# Patient Record
Sex: Female | Born: 1980
Health system: Southern US, Community
[De-identification: ages and names within clinical notes are randomized; demographics above are authoritative.]

## PROBLEM LIST (undated history)

## (undated) DIAGNOSIS — IMO0002 Reserved for concepts with insufficient information to code with codable children: Secondary | ICD-10-CM

## (undated) DIAGNOSIS — O99345 Other mental disorders complicating the puerperium: Secondary | ICD-10-CM

## (undated) DIAGNOSIS — F53 Postpartum depression: Secondary | ICD-10-CM

## (undated) HISTORY — DX: Reserved for concepts with insufficient information to code with codable children: IMO0002

## (undated) HISTORY — DX: Postpartum depression: F53.0

## (undated) HISTORY — PX: TUBAL LIGATION: SHX77

## (undated) HISTORY — DX: Other mental disorders complicating the puerperium: O99.345

---

## 2010-05-19 ENCOUNTER — Encounter: Payer: Self-pay | Admitting: Family Medicine

## 2010-05-19 ENCOUNTER — Ambulatory Visit: Admission: RE | Admit: 2010-05-19 | Discharge: 2010-05-19 | Payer: Self-pay | Source: Home / Self Care

## 2010-05-19 ENCOUNTER — Other Ambulatory Visit: Payer: Self-pay | Admitting: Family Medicine

## 2010-05-19 LAB — CONVERTED CEMR LAB
Antibody Screen: NEGATIVE
Eosinophils Relative: 1 % (ref 0–5)
HCT: 36.1 % (ref 36.0–46.0)
Hemoglobin: 11.8 g/dL — ABNORMAL LOW (ref 12.0–15.0)
Lymphocytes Relative: 27 % (ref 12–46)
MCHC: 32.7 g/dL (ref 30.0–36.0)
Monocytes Absolute: 0.9 10*3/uL (ref 0.1–1.0)
Monocytes Relative: 12 % (ref 3–12)
Neutro Abs: 4.5 10*3/uL (ref 1.7–7.7)
RBC: 4.08 M/uL (ref 3.87–5.11)
Rh Type: POSITIVE
Rubella: 496.8 intl units/mL — ABNORMAL HIGH
Sickle Cell Screen: NEGATIVE

## 2010-05-20 LAB — OBSTETRIC PANEL
Eosinophils Absolute: 0.1 10*3/uL (ref 0.0–0.7)
Eosinophils Relative: 1 % (ref 0–5)
HCT: 36.1 % (ref 36.0–46.0)
Hemoglobin: 11.8 g/dL — ABNORMAL LOW (ref 12.0–15.0)
Lymphocytes Relative: 27 % (ref 12–46)
Lymphs Abs: 2 10*3/uL (ref 0.7–4.0)
MCH: 28.9 pg (ref 26.0–34.0)
MCV: 88.5 fL (ref 78.0–100.0)
Monocytes Relative: 12 % (ref 3–12)
Platelets: 230 10*3/uL (ref 150–400)
RBC: 4.08 MIL/uL (ref 3.87–5.11)
Rh Type: POSITIVE
Rubella: 496.8 IU/mL — ABNORMAL HIGH
WBC: 7.5 10*3/uL (ref 4.0–10.5)

## 2010-05-21 LAB — SICKLE CELL SCREEN: Sickle Cell Screen: NEGATIVE

## 2010-05-26 ENCOUNTER — Encounter: Payer: Self-pay | Admitting: Family Medicine

## 2010-05-26 ENCOUNTER — Other Ambulatory Visit: Payer: Self-pay | Admitting: Family Medicine

## 2010-05-26 ENCOUNTER — Encounter (INDEPENDENT_AMBULATORY_CARE_PROVIDER_SITE_OTHER): Payer: Self-pay | Admitting: Family Medicine

## 2010-05-26 ENCOUNTER — Other Ambulatory Visit: Payer: Self-pay

## 2010-05-26 DIAGNOSIS — Z3480 Encounter for supervision of other normal pregnancy, unspecified trimester: Secondary | ICD-10-CM

## 2010-05-26 DIAGNOSIS — B373 Candidiasis of vulva and vagina: Secondary | ICD-10-CM

## 2010-05-26 DIAGNOSIS — Z124 Encounter for screening for malignant neoplasm of cervix: Secondary | ICD-10-CM

## 2010-05-26 DIAGNOSIS — B3731 Acute candidiasis of vulva and vagina: Secondary | ICD-10-CM

## 2010-05-26 DIAGNOSIS — N3000 Acute cystitis without hematuria: Secondary | ICD-10-CM | POA: Insufficient documentation

## 2010-05-27 LAB — GC/CHLAMYDIA PROBE AMP, GENITAL
Chlamydia, DNA Probe: NEGATIVE
GC Probe Amp, Genital: NEGATIVE

## 2010-05-28 ENCOUNTER — Ambulatory Visit: Payer: Self-pay

## 2010-05-28 ENCOUNTER — Ambulatory Visit (INDEPENDENT_AMBULATORY_CARE_PROVIDER_SITE_OTHER): Payer: Self-pay | Admitting: *Deleted

## 2010-05-28 DIAGNOSIS — Z0130 Encounter for examination of blood pressure without abnormal findings: Secondary | ICD-10-CM

## 2010-05-28 DIAGNOSIS — Z136 Encounter for screening for cardiovascular disorders: Secondary | ICD-10-CM

## 2010-05-28 NOTE — Progress Notes (Deleted)
Patient reports that after starting metoprolol she developed chest tightness . She only took 3 tabs of the medication . She has continued taking HCTZ. No further chest pain.  Rx for lisinopril was sent in but patient has not picked up yet. Consulted with Dr. Clotilde Dieter and she advises for patient to start with 1/2 tab of  Lisinopril 10 mg  ( 5 mg daily ). Return for BP check again in 2 weeks.

## 2010-05-28 NOTE — Miscellaneous (Signed)
  Clinical Lists Changes  Problems: Removed problem of VULVAR ABSCESS (ICD-616.4)

## 2010-05-28 NOTE — Progress Notes (Signed)
Patient seen with Myrlene Broker in RN clinic.  Verified positive PPD.  Patient states that she had a positive PPD in [place] 7 yrs ago; reports that she had a negative CXR then, was never treated with INH for prophylaxis.   Denies cough, sweats or hemoptysis.  Will not recommend INH or health department referral until after delivery.  Discussion held in both [place] and [place].

## 2010-05-28 NOTE — Progress Notes (Signed)
Patient here for PPD to be read. PPD 20 X 24 mm. Patient reports she had a positive PPD 7 years ago. Dr. Mauricio Po notified and came in to see patient . He states at this time  no follow up required . Will address issue after patient delivers baby.

## 2010-06-05 ENCOUNTER — Ambulatory Visit (INDEPENDENT_AMBULATORY_CARE_PROVIDER_SITE_OTHER): Payer: Self-pay | Admitting: Family Medicine

## 2010-06-05 VITALS — BP 109/70 | Wt 146.7 lb

## 2010-06-05 DIAGNOSIS — Z3480 Encounter for supervision of other normal pregnancy, unspecified trimester: Secondary | ICD-10-CM

## 2010-06-05 DIAGNOSIS — N3000 Acute cystitis without hematuria: Secondary | ICD-10-CM

## 2010-06-05 NOTE — Progress Notes (Signed)
Subjective:    Lori Vaughan is a 30 y.o. female being seen today for her obstetrical visit. She is at [redacted]w[redacted]d gestation. Patient reports no complaints. Fetal movement: normal.  Objective:    BP 109/70  Wt 146 lb 11.2 oz (66.543 kg)  LMP 01/17/2010  Physical Exam  Exam  FHT:  140 BPM  Uterine Size: size equals dates  Presentation: unsure     Assessment:    Pregnancy:  [id number]    Plan:    Patient Active Problem List  Diagnoses  . CANDIDIASIS, VULVOVAGINAL  . ACUTE CYSTITIS    Treated for asymptomatic bacteruria, completed ABx.  For TOC UCx today.   For [place] with Dr Gaynell Face, given card with phone number to schedule appt there.  Had positive PPD placed, prior hx pos PPD.  No cough/hemoptysis/wt loss or fever.  Will defer further prophylaxis until after delivery, will refer to health dept then.  Follow up in 4 weeks with Dr Ashley Royalty.

## 2010-06-05 NOTE — Patient Instructions (Signed)
Fue un Research officer, trade union.  Todo aparenta estar bien con su embarazo.   Como hablamos, quiero que vaya a hacerse el ultrasonido lo antes posible.  Tiene el telefono del Dr. Gaynell Face para marcar la cita.   Siga tomando las vitaminas antenatales.   Por favor marque una cita con el Dr. Ashley Royalty in 4 semanas.

## 2010-06-05 NOTE — Assessment & Plan Note (Signed)
Summary: NOB-18.5 weeks by LMP   OB Initial Intake Information    Positive HCG by: [facility] [facility] [employer]    Race: White    Marital status: Single    Occupation: homemaker    Education (last grade completed): 12    Number of children at home: 3  FOB Information    Husband/Father of baby: Shari Prows    FOB occupation Gardening  Menstrual History    LMP (date): 01/17/2010    EDC by LMP: 10/24/2010    LMP - Character: light    LMP - Reliable? : Yes    Menarche: 11 years    Menses interval: 28 days    Menstrual flow 3-4 days    On BCP's at conception: no    Date of positive (+) home preg. test: 04/03/2010    Pre Pregnancy Weight: 142 lbs.    Symptoms since LMP: amenorrhea, nausea, fatigue, irritability, urinary frequency  Past Pregnancy History    Gravida:     5    Term Births:     3    Premature Births:   0    Living Children:   3    Para:       3    Mult. Births:     0    Prev C-Section:   3    Prev. VBAC attempt?   none    Aborta:     1    Elect. Ab:     0    Spont. Ab:     1    Ectopics:     0  Pregnancy # 1    Delivery date:     01/04/1997    Weeks Gestation:   41    Preterm labor:     no    Delivery type:     C-section    Anesthesia type:     spinal    Delivery location:     [place]    Infant Sex:     Female    Name:     Lars Mage  Pregnancy # 2    Delivery date:     02/22/1998    Weeks Gestation:   37    Preterm labor:     no    Delivery type:     C-section    Anesthesia type:     spinal    Delivery location:     [place]    Infant Sex:     Female    Name:     Tobi Bastos  Pregnancy # 3    Delivery date:     2004    Preterm labor:     no    Anesthesia type:     none    Comments:     SAB  Pregnancy # 4    Delivery date:     05/24/2003    Weeks Gestation:   37    Preterm labor:     no    Delivery type:     C-section    Delivery location:     [place], [place]    Infant Sex:     Female    Birth weight:     7lbs    Name:     Air cabin crew  Social History: Education:  12 Hepatitis Risk:   no Packs/Day:  n/a  Risk Factors:   Genetic History    Genetic History Reviewed:    Father of baby:   Shari Prows  Thalassemia:     mother: no   father: no    Neural tube defect:   mother: no   father: no    Down's Syndrome:   mother: no   father: no    Tay-Sachs:     mother: no   father: no    Sickle Cell Dz/Trait:   mother: no   father: no    Hemophilia:     mother: no   father: no    Muscular Dystrophy:   mother: no   father: no    Cystic Fibrosis:   mother: no   father: no    Huntington's Dz:   mother: no   father: no    Mental Retardation:   mother: no   father: no    Fragile X:     mother: no   father: no    Other Genetic or       Chromosomal Dz:   mother: no   father: no    Child with other       birth defect:     mother: no   father: no    > 3 spont. abortions:   mother: no    Hx of stillbirth:     mother: no  Infection Risk History    High Risk Hepatitis B: no    Immunized against Hepatitis B: yes    Exposure to TB: no    Patient with history of Genital Herpes: no    Sexual partner with history of Genital Herpes: no    History of STD (GC, Chlamydia, Syphilis, HPV): yes    Specific STD: Chlamydia    Rash, Viral, or Febrile Illness since LMP: no    Exposure to Cat Litter: no    History of Parvovirus (Fifth Disease): no    Occupational Exposure to Children: none  Environmental Exposures    Xray Exposure since LMP: no    Chemical or other exposure: no    Medication, drug, or alcohol use since LMP: no  Additional Infection/Environmental Comments:    Chlamydia 6 years ago  [employer] for Follow-up Visit    Estimated weeks of       gestation:     18.5    Weight:     149.2    Blood pressure:   120 / 64    Hx headache?     daily    Nausea/vomiting?   nausea    Edema?     0    Bleeding?     no    Leakage/discharge?   d/c    Fetal activity:       no    Labor symptoms?   no    Fundal height:      20    FHR:       150    Fetal position:      N/A    Cx  dilation:     0    Cx effacement:   0    Fetal station:     high    Taking Vitamins?   Y    Smoking PPD:   n/a    Next visit:     4 wk    Resident:     CEM    Preceptor:     Sheffield Slider  Physical Examination  Vital Signs:  P: 84  BP (upright): 120/64  Wt: 149.2    General Exam:  Constitutional:    alert, no acute distress,  well hydrated, well developed, and well nourished.   Skin:    normal turgor, normal color, no rashes, no lesions, and no unusual bruising.   Head:    atraumatic and normocephalic.   Eyes:    EOM intact, PERRLA, vision grossly normal, and fundi normal.   Ears:    no external deformities, canals clear, and TM's pearly gray.   Mouth:    good dentition, no erythema, no exudates, and no lesions.   Neck:    supple, no adenopathy, no masses, thyroid normal size, and no thyroid tenderness or nodules.   Cardiovascular:    RRR, no murmurs, no gallops, and no edema.   Respiratory:    no respiratory distress and clear to auscultation.   Abdomen:    gravid, nontender, no guarding, and normal BS.   Neurologic:    cranial nerves II-XII intact and DTR's normal.   Psychiatric:    affect and mood appropriate, normal interaction, and good eye contact.    Pelvic Exam:  Vulva:    normal appearance.   Vagina:    normal.   Cervix:    normal, parous, no motion tenderness, no lesions, and FRIABLE.  Curd like d/c Uterus:    gravid and non-tender.    Impression & Recommendations:  Problem # 1:  SUPERVISION OF OTHER NORMAL PREGNANCY (ICD-V22.1) Doing well, pap, gc/chlamydia done today.  Having daily headaches, bp wnl today, will continue to monitor through her f/u appointments.  Pt. wants [place] but does not have enough money at this time, plans to have done first part of march when she has the money.  Also with history of positive PPD with negative CXR, will place another PPD today.  Plans to have repeat c/s, will need to be set up for rpt c/s at 39 weeks Orders: Wet Prep- [facility]  [id number]) GC/Chlamydia-[facility] ([id number]) Pap Smear-[facility] ([id number]) Prenatal U/S > 14 weeks - 62952 (Prenatal U/S) Other OB visit- [facility] (OBCK)  Problem # 2:  CANDIDIASIS, VULVOVAGINAL (ICD-112.1) Wet prep c/w yeast infection.  WIll treat with diflucan Her updated medication list for this problem includes:    Diflucan 150 Mg Tabs (Fluconazole) .Marland Kitchen... 1 tab by mouth once  Orders: Other OB visit- [facility] (OBCK)  Problem # 3:  ACUTE CYSTITIS (ICD-595.0) Urine cx with >100,000 colonies of E. Coli, also symptomatic.  WIll treat with Keflex. Her updated medication list for this problem includes:    Keflex 500 Mg Caps (Cephalexin) .Marland Kitchen... 1 tab by mouth two times a day  Orders: Other OB visit- [facility] (OBCK)  Complete Medication List: 1)  Prenatal/folic Acid Tabs (Prenatal vit-fe fumarate-fa) .Marland Kitchen.. 1 tab by mouth daily 2)  Keflex 500 Mg Caps (Cephalexin) .Marland Kitchen.. 1 tab by mouth two times a day 3)  Diflucan 150 Mg Tabs (Fluconazole) .Marland Kitchen.. 1 tab by mouth once  Other Orders: TB Skin Test [phone]) Admin 1st Vaccine ([id number])  Patient Instructions: 1)  Fue un placer conocerte hoy. Felicitaciones por su embarazo, espero que despus de que a travs de su embarazo. Que le permitir saber si alguno de sus pruebas a partir de hoy fueron anormales. He enviado una receta para [facility] [place] [place] de un antibitico para tratar su infeccin urinaria. Por favor, vuelve el mircoles para tener la prueba de la tuberculosis leer. Le hemos programado una cita con el Dr. Henri Medal el costo es de $ 200. Es posible que haya algunas manchas de Maria Stein de su examen en la actualidad. Si esto contina ms  all de unos pocos das nos dan una llamada. Quiero verte otra vez en 4 semanas.  Durante las prximas semanas se puede esperar para comenzar tal vez para sentir el movimiento del beb.  Flowsheet View for Follow-up Visit    Estimated weeks of       gestation:     18.5    Weight:     149.2    Blood pressure:   120 /  64    Headache:     daily    Nausea/vomiting:   nausea    Edema:     0    Vaginal bleeding:   no    Vaginal discharge:   d/c    Fundal height:      20    FHR:       150    Fetal activity:     no    Labor symptoms:   no    Fetal position:     N/A    Cx Dilation:     0    Cx Effacement:   0    Cx Station:     high    Taking prenatal vits?   Y    Smoking:     n/a    Next visit:     4 wk    Resident:     CEM    Preceptor:     Sheffield Slider   Prenatal Visit    FOB name: Shari Prows Concerns noted: Vaginal discharge, dysuria, burning and itching.   EDC Confirmation:    LMP reliable? Yes    Last menses onset (LMP) date: 01/17/2010    EDC by LMP: 10/24/2010   Immunizations Administered:  PPD Skin Test:    Vaccine Type: PPD    Site: left forearm    Mfr: Sanofi Pasteur    Dose: 0.1 ml    Route: ID    Given by: Jone Baseman CMA    Exp. Date: 12/21/2011    Lot #: [id number]   Laboratory Results  Date/Time Received: May 26, 2010 11:04 AM  Date/Time Reported: May 26, 2010 12:42 PM   [employer] Source: vag WBC/hpf: 1-5 Bacteria/hpf: 3+  Rods Clue cells/hpf: none  Negative whiff Yeast/hpf: many Trichomonas/hpf: none Comments: ...............test performed by......Marland KitchenBonnie A. Sw[place], MLS (ASCP)cm     Appended Document: NOB-18.5 weeks by LMP Note reviewed.  Will need repeat C/S planning visit with OB, for history of multiple C/S.  We are not placing PPDs on prenatal patients; patients with prior h/o positive PPD do not need another PPD.   Appended Document: NOB-18.5 weeks by LMP Will need a urine cx TOC in 3 weeks.

## 2010-06-07 LAB — URINE CULTURE: Colony Count: NO GROWTH

## 2010-07-02 ENCOUNTER — Ambulatory Visit (INDEPENDENT_AMBULATORY_CARE_PROVIDER_SITE_OTHER): Payer: Self-pay | Admitting: Family Medicine

## 2010-07-02 VITALS — BP 114/65 | Wt 157.1 lb

## 2010-07-02 DIAGNOSIS — Z3490 Encounter for supervision of normal pregnancy, unspecified, unspecified trimester: Secondary | ICD-10-CM

## 2010-07-02 DIAGNOSIS — Z3480 Encounter for supervision of other normal pregnancy, unspecified trimester: Secondary | ICD-10-CM

## 2010-07-02 NOTE — Progress Notes (Signed)
  Subjective:    Patient ID: Lori Vaughan, female    DOB: [birth year], 30 y.o.   MRN: [id number]  HPI Here at 23weeks 5 days by LMP.  Having increased reflux taking tums which helps.  No further vaginal d/c since previous appt.  Has some pain along previous c-section scar, intermittent sharp pain, infrequent.  Has felt baby moving.  Still unable to afford U/S with Dr. Gaynell Face.  Filling out paper work for project access today.     Review of Systems  Eyes: Negative for visual disturbance.  Respiratory: Negative for shortness of breath.   Cardiovascular: Negative for chest pain and leg swelling.  Gastrointestinal: Negative for nausea, vomiting, diarrhea and abdominal distention.  Genitourinary: Negative for dysuria, vaginal bleeding and vaginal discharge.  Neurological: Negative for headaches.  Psychiatric/Behavioral: Negative for suicidal ideas and self-injury.       Objective:   Physical Exam  Constitutional: She appears well-developed and well-nourished.  Cardiovascular: Normal rate and regular rhythm.   Pulmonary/Chest: Breath sounds normal.  Abdominal:       Gravid, previous c/s scar not ttp.  Musculoskeletal: She exhibits no edema.          Assessment & Plan:

## 2010-07-02 NOTE — Patient Instructions (Addendum)
Fue agradable ver que [employer]. Su examen se ve muy bien, el beb tiene latidos del corazn bueno y est midiendo adecuadamente. Os animo a obtener el ultrasonido cuando son capaces de pagarlo o se establecen con el acceso del proyecto (de color naranja de la tarjeta). Si usted necesita el nmero de la oficina del Dr. Waymond Cera vez ms por favor hganoslo saber. Me gustara volver a verte en cuatro semanas para ver cmo le est yendo. Yo quiero que usted tenga su prueba de [employer], puede programar que el delantero fuera. Puede seguir Tums para la Merchant navy officer y puede tomar Tylenol para el dolor de [place]. Recuerde que si usted se siente la disminucin del movimiento fetal, tiene sangrado vaginal, dolor abdominal filtracin de lquidos o persistente ir al [facility].

## 2010-07-10 ENCOUNTER — Encounter: Payer: Self-pay | Admitting: Family Medicine

## 2010-07-17 ENCOUNTER — Other Ambulatory Visit: Payer: Self-pay | Admitting: Family Medicine

## 2010-07-17 ENCOUNTER — Other Ambulatory Visit: Payer: Self-pay

## 2010-07-17 NOTE — Progress Notes (Signed)
Pt came for 1 hr glucose = 140 mg/dl.  3 hr gtt scheduled for Tues 07-22-10 Dewitt Hoes, MLS

## 2010-07-22 ENCOUNTER — Other Ambulatory Visit: Payer: Self-pay

## 2010-07-22 ENCOUNTER — Other Ambulatory Visit: Payer: Self-pay | Admitting: Family Medicine

## 2010-07-22 DIAGNOSIS — O99810 Abnormal glucose complicating pregnancy: Secondary | ICD-10-CM

## 2010-07-22 NOTE — Progress Notes (Signed)
3 hour GTT completed; Fasting was collected by capillary, 1,2,3 hour was done by venous.  [employer]

## 2010-07-24 LAB — GLUCOSE TOLERANCE, 3 HOURS
Glucose Tolerance, 1 hour: 122 mg/dL (ref 70–189)
Glucose Tolerance, 2 hour: 118 mg/dL (ref 70–164)
Glucose, GTT - 3 Hour: 70 mg/dL (ref 70–144)

## 2010-08-01 ENCOUNTER — Ambulatory Visit (INDEPENDENT_AMBULATORY_CARE_PROVIDER_SITE_OTHER): Payer: Self-pay | Admitting: Family Medicine

## 2010-08-01 VITALS — BP 129/77 | Wt 158.5 lb

## 2010-08-01 DIAGNOSIS — Z3480 Encounter for supervision of other normal pregnancy, unspecified trimester: Secondary | ICD-10-CM

## 2010-08-01 DIAGNOSIS — R109 Unspecified abdominal pain: Secondary | ICD-10-CM

## 2010-08-01 DIAGNOSIS — Z3490 Encounter for supervision of normal pregnancy, unspecified, unspecified trimester: Secondary | ICD-10-CM

## 2010-08-01 LAB — POCT URINALYSIS DIPSTICK
Bilirubin, UA: NEGATIVE
Nitrite, UA: NEGATIVE
Protein, UA: 30
Urobilinogen, UA: 0.2
pH, UA: 7.5

## 2010-08-01 LAB — RPR

## 2010-08-01 LAB — POCT UA - MICROSCOPIC ONLY

## 2010-08-01 LAB — CBC
MCHC: 33.1 g/dL (ref 30.0–36.0)
RDW: 14.5 % (ref 11.5–15.5)

## 2010-08-01 NOTE — Patient Instructions (Addendum)
Se que vemos hoy. Estoy comprobando su orina para Landscape architect infeccin en la actualidad. La direccin de [facility]osptial [facility] es [address] [place], [place] [address] Telfono: [phone]. Voy a conseguirle una cita para reunirse con los mdicos all para hablar de otra cesrea. Si usted ha [employer] u opresin en el abdomen, sangrado vaginal, prdida de lquido o no se siente a su beb moverse como normal tienes que ir al hospital de [place].

## 2010-08-02 LAB — HIV ANTIBODY (ROUTINE TESTING W REFLEX): HIV: NONREACTIVE

## 2010-08-03 LAB — CULTURE, OB URINE

## 2010-08-03 MED ORDER — CEPHALEXIN 500 MG PO CAPS: 500.0000 mg | ORAL_CAPSULE | Freq: Two times a day (BID) | ORAL | Status: AC

## 2010-08-04 NOTE — Assessment & Plan Note (Signed)
Good Fetal movement Measurements c/w dates FHR 145 Needs appointment to meet with OB docs to discuss repeat C/S No LOF, vaginal bleeding, contractions Pre-term labor precautions reviewed UA consistent with UTI, will send for culture. Start keflex x10 days, repeat urine culture at next visit.

## 2010-08-04 NOTE — Progress Notes (Signed)
Here at 28 weeks, complaint of R flank pain today.  Denies dysuria, fever, chills, hematuria.  Scant vaginal discharge. Pain comes and goes, only on right side.   Exam: Abd: No abdominal tenderness to palpation, no cva tenderness. CV: RRR Pulm: CTAB Good Fetal movement Measurements c/w dates FHR 145 Needs appointment to meet with OB docs to discuss repeat C/S No LOF, vaginal bleeding, contractions Pre-term labor precautions reviewed UA consistent with UTI, will send for culture. Start keflex x10 days, repeat urine culture at next visit.  [facility] Pregnancy Home Risk Screening Form  1. Thinking back to just before you got pregnant how did you feel about becoming pregnant? []   I wanted to be pregnant sooner. [x]   I wanted to be pregnant now. []   I wanted to be pregnant later. []   I did not want to be pregnant then or any time in the future []   I don't know  2.  Within the last year, have you been hit, slapped, kicked or otherwise physically hurt by someone?  No  3.  Are you in a relationship with a person who threatens or physically hurts you? No  4.  Has anyone forced you to have sexual activities that made you feel uncomfortable? No  5.  In the last 12 months were you ever hungry but didn't east because you couldn't afford enough food?  No  6.  Do you have a safe and stable place to live?  Yes  7.  Which statement best describes your smoking status?   [x]   I have never smoked, or have smoked less than 100 cigarettes in my lifetime []   I stopped smoking BEFORE I found out I was pregnant and am not smoking now []   I stopped smoking AFTER I found out I was pregnant and am not smoking now []   I smoke now but have cut down some since I found out I was pregnant []   I smoke about the same amount now as I did before I found out I was pregnant  8.  Did any of your parents have a problem with alcohol or other drug use? Yes  9.  Do any of your friends have a problem with alcohol or other  drug use? Yes  10.  Does your partner have a problem with alcohol or other drug use? No  11.  In the past, have you had difficulties in your life due to alcohol or other drugs, including prescription medications? No  12.  Before you knew you were pregnant, how often did you drink any alcohol, including beer or wine, or use other drugs?  []   Not at all [x]   Rarely []   Sometimes []   Frequently  13.  In the past month, how often did you drink any alcohol, including beer or wine, or use another drug? [x]   Not at all []   Rarely []   Sometimes []   Frequently

## 2010-08-11 ENCOUNTER — Ambulatory Visit (INDEPENDENT_AMBULATORY_CARE_PROVIDER_SITE_OTHER): Payer: Self-pay | Admitting: Family Medicine

## 2010-08-11 VITALS — BP 104/64 | Temp 97.9°F | Wt 159.9 lb

## 2010-08-11 DIAGNOSIS — Z3490 Encounter for supervision of normal pregnancy, unspecified, unspecified trimester: Secondary | ICD-10-CM

## 2010-08-11 DIAGNOSIS — Z3480 Encounter for supervision of other normal pregnancy, unspecified trimester: Secondary | ICD-10-CM

## 2010-08-11 NOTE — Patient Instructions (Addendum)
Fue agradable ver que [employer]. Por favor, vaya a la farmacia a recoger su antibitico y tomar [employer] se han ido todos. Me gustara verte de [employer]. Recuerde que si tiene contracciones, prdida de lquido o sangre por favor vaya a un hospital de la [place]. Si usted se siente beb no se mueve como ella normalmente es del agrado de hacer el recuento de patadas de las que hablamos. Usted tiene una [facility] la [place] el 4 de junio de 2012 a las 10:00 am para discutir con un parto por cesrea.

## 2010-08-12 NOTE — Progress Notes (Signed)
Here at 29 weeks 4/7 days.  Doing well, flank pain resolved.  Urine cx grew 95k colonies of strep viridans at last visit.  Did not pick up antibiotic after last visit.  Denies dysuria, fever, chills, hematuria.     Good Fetal movement Measurements c/w dates FHR 135 Appt made for 09/22/10 at 10am to meet with OB faculty to discuss C/S No LOF, vaginal bleeding, contractions Pre-term labor precautions reviewed, kick count reviewed. Urine cx with 95k colonies of strep viridans Instructed to pick up keflex x10 days, repeat urine culture at next visit. Follow up in two weeks. Precepted with Dr. Mauricio Po

## 2010-08-12 NOTE — Assessment & Plan Note (Signed)
Here at 29 weeks 4/7 days.  Doing well, flank pain resolved.  Urine cx grew 95k colonies of strep viridans at last visit.  Did not pick up antibiotic after last visit.  Denies dysuria, fever, chills, hematuria.     Good Fetal movement Measurements c/w dates FHR 135 Appt made for 09/22/10 at 10am to meet with OB faculty to discuss C/S No LOF, vaginal bleeding, contractions Pre-term labor precautions reviewed, kick count reviewed. Urine cx with 95k colonies of strep viridans Instructed to pick up keflex x10 days, repeat urine culture at next visit. Follow up in two weeks. Precepted with Dr. Breen     

## 2010-08-18 ENCOUNTER — Telehealth: Payer: Self-pay | Admitting: *Deleted

## 2010-08-18 DIAGNOSIS — Z3490 Encounter for supervision of normal pregnancy, unspecified, unspecified trimester: Secondary | ICD-10-CM

## 2010-08-18 NOTE — Telephone Encounter (Signed)
Selena Batten, Can you please order the [place]

## 2010-08-18 NOTE — Telephone Encounter (Signed)
Message copied by Jimmy Footman on Mon Aug 18, 2010  3:45 PM ------      Message from: MATTHEWS, CODY      Created: Mon Aug 11, 2010  9:23 AM      Regarding: [place]       Please make appointment for [place] at [facility] for patient.             Thanks       CM

## 2010-08-20 NOTE — Telephone Encounter (Signed)
Order has been placed previously, should still be good.  Look under order entry.  CM

## 2010-08-27 ENCOUNTER — Ambulatory Visit (INDEPENDENT_AMBULATORY_CARE_PROVIDER_SITE_OTHER): Payer: Self-pay | Admitting: Family Medicine

## 2010-08-27 ENCOUNTER — Telehealth: Payer: Self-pay | Admitting: *Deleted

## 2010-08-27 VITALS — BP 110/60 | Wt 159.0 lb

## 2010-08-27 DIAGNOSIS — Z3490 Encounter for supervision of normal pregnancy, unspecified, unspecified trimester: Secondary | ICD-10-CM

## 2010-08-27 DIAGNOSIS — Z3480 Encounter for supervision of other normal pregnancy, unspecified trimester: Secondary | ICD-10-CM

## 2010-08-27 NOTE — Telephone Encounter (Signed)
Spoke with [facility] and set up [place] for patient for 5/11 @ 2:30pm, pt to arrive @ 2:15pm. Patient notified in office and agreed to appointment

## 2010-08-29 ENCOUNTER — Ambulatory Visit (HOSPITAL_COMMUNITY)
Admission: RE | Admit: 2010-08-29 | Discharge: 2010-08-29 | Disposition: A | Discharge status: Home / Self Care | Payer: Self-pay | Source: Ambulatory Visit | Attending: Family Medicine | Admitting: Family Medicine

## 2010-08-29 DIAGNOSIS — Z3490 Encounter for supervision of normal pregnancy, unspecified, unspecified trimester: Secondary | ICD-10-CM

## 2010-08-29 DIAGNOSIS — Z3689 Encounter for other specified antenatal screening: Secondary | ICD-10-CM | POA: Insufficient documentation

## 2010-08-31 NOTE — Assessment & Plan Note (Signed)
Here at 31 weeks 5/7 days.  Doing well.  No dysuria, just recently picked up antibiotics.  Not finished with therapy yet.  Urine cx previously with 95k colonies of strep viridans.   Has been feeling occasional contractions.  Feels like stomach gets really hard at times.  Last only for a few seconds.  Usually comes in the afternoon when she is really tired.   Has had good fetal movement. Denis Vaginal bleeding, discharge, lof. FHR 135 Appt made for 09/22/10 at 10am to meet with OB faculty to discuss C/S Pre-term labor precautions reviewed, kick count reviewed. Urine cx with 95k colonies of strep viridans Instructed to complete antibiotics, repeat urine culture at next visit. Follow up in two weeks.

## 2010-08-31 NOTE — Progress Notes (Signed)
Here at 31 weeks 5/7 days.  Doing well.  No dysuria, just recently picked up antibiotics.  Not finished with therapy yet.  Urine cx previously with 95k colonies of strep viridans.   Has been feeling occasional contractions.  Feels like stomach gets really hard at times.  Last only for a few seconds.  Usually comes in the afternoon when she is really tired.   Has had good fetal movement. Denis Vaginal bleeding, discharge, lof. FHR 135 Appt made for 09/22/10 at 10am to meet with OB faculty to discuss C/S Pre-term labor precautions reviewed, kick count reviewed. Urine cx with 95k colonies of strep viridans Instructed to complete antibiotics, repeat urine culture at next visit. Follow up in two weeks. Precepted with Dr. Mauricio Po

## 2010-09-11 ENCOUNTER — Ambulatory Visit (INDEPENDENT_AMBULATORY_CARE_PROVIDER_SITE_OTHER): Payer: Self-pay | Admitting: Family Medicine

## 2010-09-11 VITALS — BP 112/66 | Wt 160.0 lb

## 2010-09-11 DIAGNOSIS — O2340 Unspecified infection of urinary tract in pregnancy, unspecified trimester: Secondary | ICD-10-CM | POA: Insufficient documentation

## 2010-09-11 DIAGNOSIS — N390 Urinary tract infection, site not specified: Secondary | ICD-10-CM

## 2010-09-11 DIAGNOSIS — Z3480 Encounter for supervision of other normal pregnancy, unspecified trimester: Secondary | ICD-10-CM

## 2010-09-11 DIAGNOSIS — O2390 Unspecified genitourinary tract infection in pregnancy, unspecified trimester: Secondary | ICD-10-CM

## 2010-09-11 DIAGNOSIS — Z3490 Encounter for supervision of normal pregnancy, unspecified, unspecified trimester: Secondary | ICD-10-CM

## 2010-09-22 NOTE — Assessment & Plan Note (Signed)
Here at 33 weeks 6/7 days.  Doing well, excited she is having a girl.  Completed antibiotics.  Will get repeat urine culture today.  Urine cx previously with 95k colonies of strep viridans.   Has been feeling occasional contractions.  No pain currently.  Has had good fetal movement. Denies Vaginal bleeding, discharge, lof. Appt made for 09/22/10 at 10am to meet with OB faculty to discuss C/S Pre-term labor precautions reviewed, kick count reviewed. F/u urine culture at next visit Follow up in two weeks. Precepted with Dr. Deirdre Priest

## 2010-09-22 NOTE — Progress Notes (Signed)
Here at 33 weeks 6/7 days.  Doing well, excited she is having a girl.  Completed antibiotics.  Will get repeat urine culture today.  Urine cx previously with 95k colonies of strep viridans.   Has been feeling occasional contractions.  No pain currently.  Has had good fetal movement. Denies Vaginal bleeding, discharge, lof. Appt made for 09/22/10 at 10am to meet with OB faculty to discuss C/S Pre-term labor precautions reviewed, kick count reviewed. F/u urine culture at next visit Follow up in two weeks. Precepted with Dr. Chambliss 

## 2010-09-24 ENCOUNTER — Ambulatory Visit (INDEPENDENT_AMBULATORY_CARE_PROVIDER_SITE_OTHER): Payer: Self-pay | Admitting: Family Medicine

## 2010-09-24 VITALS — BP 108/58 | Wt 162.0 lb

## 2010-09-24 DIAGNOSIS — Z3480 Encounter for supervision of other normal pregnancy, unspecified trimester: Secondary | ICD-10-CM

## 2010-09-24 DIAGNOSIS — Z3490 Encounter for supervision of normal pregnancy, unspecified, unspecified trimester: Secondary | ICD-10-CM

## 2010-09-24 NOTE — Progress Notes (Signed)
Doing well. Reviewed Hx. Upcoming appointment at [facility] for scheduled C-Section on October 17, 2010. No preterm labor symptoms. Follow up in 1 week. NOTE: Patient left prior to UA. Will need next week. GC/CH and GBS completed today. Patient interested in Implanon for PP contraception. Understands kick counts and preterm labor precautions. Knows where [facility] is.

## 2010-09-24 NOTE — Patient Instructions (Addendum)
It was nice to meet you today.  Follow up in 1 week.  (Preterm labor precautions reviewed using interpretor).

## 2010-09-27 LAB — CULTURE, BETA STREP (GROUP B ONLY)

## 2010-10-02 ENCOUNTER — Ambulatory Visit (INDEPENDENT_AMBULATORY_CARE_PROVIDER_SITE_OTHER): Payer: Self-pay | Admitting: Family Medicine

## 2010-10-02 VITALS — BP 119/77 | Wt 164.0 lb

## 2010-10-02 DIAGNOSIS — O2340 Unspecified infection of urinary tract in pregnancy, unspecified trimester: Secondary | ICD-10-CM

## 2010-10-02 DIAGNOSIS — Z3490 Encounter for supervision of normal pregnancy, unspecified, unspecified trimester: Secondary | ICD-10-CM

## 2010-10-02 DIAGNOSIS — L988 Other specified disorders of the skin and subcutaneous tissue: Secondary | ICD-10-CM

## 2010-10-02 DIAGNOSIS — N390 Urinary tract infection, site not specified: Secondary | ICD-10-CM

## 2010-10-02 DIAGNOSIS — O2390 Unspecified genitourinary tract infection in pregnancy, unspecified trimester: Secondary | ICD-10-CM

## 2010-10-02 DIAGNOSIS — Z3480 Encounter for supervision of other normal pregnancy, unspecified trimester: Secondary | ICD-10-CM

## 2010-10-02 DIAGNOSIS — O26899 Other specified pregnancy related conditions, unspecified trimester: Secondary | ICD-10-CM

## 2010-10-02 DIAGNOSIS — O2686 Pruritic urticarial papules and plaques of pregnancy (PUPPP): Secondary | ICD-10-CM | POA: Insufficient documentation

## 2010-10-02 MED ORDER — TRIAMCINOLONE ACETONIDE 0.1 % EX CREA: TOPICAL_CREAM | Freq: Two times a day (BID) | CUTANEOUS | Status: AC

## 2010-10-09 ENCOUNTER — Ambulatory Visit (INDEPENDENT_AMBULATORY_CARE_PROVIDER_SITE_OTHER): Payer: Self-pay | Admitting: Family Medicine

## 2010-10-09 DIAGNOSIS — O2686 Pruritic urticarial papules and plaques of pregnancy (PUPPP): Secondary | ICD-10-CM

## 2010-10-09 DIAGNOSIS — L988 Other specified disorders of the skin and subcutaneous tissue: Secondary | ICD-10-CM

## 2010-10-09 DIAGNOSIS — Z3490 Encounter for supervision of normal pregnancy, unspecified, unspecified trimester: Secondary | ICD-10-CM

## 2010-10-09 DIAGNOSIS — Z3480 Encounter for supervision of other normal pregnancy, unspecified trimester: Secondary | ICD-10-CM

## 2010-10-09 DIAGNOSIS — O26899 Other specified pregnancy related conditions, unspecified trimester: Secondary | ICD-10-CM

## 2010-10-10 ENCOUNTER — Encounter (HOSPITAL_COMMUNITY)
Admission: RE | Admit: 2010-10-10 | Discharge: 2010-10-10 | Disposition: A | Discharge status: Home / Self Care | Payer: Self-pay | Source: Ambulatory Visit | Attending: Obstetrics & Gynecology | Admitting: Obstetrics & Gynecology

## 2010-10-10 DIAGNOSIS — Z01812 Encounter for preprocedural laboratory examination: Secondary | ICD-10-CM | POA: Insufficient documentation

## 2010-10-10 DIAGNOSIS — Z01818 Encounter for other preprocedural examination: Secondary | ICD-10-CM | POA: Insufficient documentation

## 2010-10-10 LAB — CBC
HCT: 35.8 % — ABNORMAL LOW (ref 36.0–46.0)
MCH: 28.1 pg (ref 26.0–34.0)
MCV: 85.2 fL (ref 78.0–100.0)
Platelets: 175 10*3/uL (ref 150–400)
RBC: 4.2 MIL/uL (ref 3.87–5.11)
WBC: 6.3 10*3/uL (ref 4.0–10.5)

## 2010-10-10 LAB — RPR: RPR Ser Ql: NONREACTIVE

## 2010-10-14 NOTE — Assessment & Plan Note (Signed)
Area on lower leg improved with Triamcinolone, continue to monitory

## 2010-10-14 NOTE — Assessment & Plan Note (Signed)
Here at 36 weeks 6/7 days.  Doing well, excited she is having a girl.  Completed antibiotics for UTI. Has area on r leg that is itchy, maculopapular in nature.  No rash elsewhere.  Tried cortisone cream with little relief. GC/CH/GBS Negative Has been feeling occasional contractions.  No pain currently.  Has had good fetal movement. Denies Vaginal bleeding, discharge, lof. C/S scheduled for 6/29 @ 1030am Pre-term labor precautions reviewed, kick count reviewed. Follow up in one week        

## 2010-10-14 NOTE — Assessment & Plan Note (Signed)
Area on leg likely PUPPP type plaque, although no areas on stomach or elsewhere.  Will give trial of triamcinolone.

## 2010-10-14 NOTE — Progress Notes (Signed)
Here at 36 weeks 6/7 days.  Doing well, excited she is having a girl.  Completed antibiotics for UTI. Has area on r leg that is itchy, maculopapular in nature.  No rash elsewhere.  Tried cortisone cream with little relief. GC/CH/GBS Negative Has been feeling occasional contractions.  No pain currently.  Has had good fetal movement. Denies Vaginal bleeding, discharge, lof. C/S scheduled for 6/29 @ 1030am Pre-term labor precautions reviewed, kick count reviewed. Follow up in one week

## 2010-10-14 NOTE — Progress Notes (Signed)
Here at 36 weeks 6/7 days.  Doing well, excited she is having a girl.  Completed antibiotics for UTI. Urine collected last visit with insignificant growth. Rash on R leg improved with triamcinolone.  No rash elsewhere.   GC/CH/GBS Negative Has been feeling occasional contractions.  No pain currently.  Has had good fetal movement. Denies Vaginal bleeding, discharge, lof. C/S scheduled for 6/29 @ 1030am Labor precautions reviewed, reviewed reasons to go to [facility] Follow up in one week 

## 2010-10-14 NOTE — Assessment & Plan Note (Signed)
Here at 36 weeks 6/7 days.  Doing well, excited she is having a girl.  Completed antibiotics for UTI. Urine collected last visit with insignificant growth. Rash on R leg improved with triamcinolone.  No rash elsewhere.   GC/CH/GBS Negative Has been feeling occasional contractions.  No pain currently.  Has had good fetal movement. Denies Vaginal bleeding, discharge, lof. C/S scheduled for 6/29 @ 1030am Labor precautions reviewed, reviewed reasons to go to [facility] Follow up in one week

## 2010-10-15 ENCOUNTER — Ambulatory Visit (INDEPENDENT_AMBULATORY_CARE_PROVIDER_SITE_OTHER): Payer: Self-pay | Admitting: Family Medicine

## 2010-10-15 DIAGNOSIS — Z3480 Encounter for supervision of other normal pregnancy, unspecified trimester: Secondary | ICD-10-CM

## 2010-10-15 NOTE — Progress Notes (Signed)
38.5 weeks. No signs of labor. Review precaution. Schedule C section on June 29th. Review labs.

## 2010-10-17 ENCOUNTER — Inpatient Hospital Stay (HOSPITAL_COMMUNITY)
Admission: RE | Admit: 2010-10-17 | Discharge: 2010-10-20 | DRG: 766 | Disposition: A | Discharge status: Home / Self Care | Payer: Medicaid Other | Source: Ambulatory Visit | Attending: Obstetrics & Gynecology | Admitting: Obstetrics & Gynecology

## 2010-10-17 DIAGNOSIS — Z302 Encounter for sterilization: Secondary | ICD-10-CM

## 2010-10-17 DIAGNOSIS — O34219 Maternal care for unspecified type scar from previous cesarean delivery: Secondary | ICD-10-CM

## 2010-10-17 DIAGNOSIS — Z01812 Encounter for preprocedural laboratory examination: Secondary | ICD-10-CM

## 2010-10-17 DIAGNOSIS — Z01818 Encounter for other preprocedural examination: Secondary | ICD-10-CM

## 2010-10-18 LAB — CBC
MCH: 27.4 pg (ref 26.0–34.0)
MCHC: 31.9 g/dL (ref 30.0–36.0)
MCV: 85.9 fL (ref 78.0–100.0)
Platelets: 170 10*3/uL (ref 150–400)
RBC: 3.69 MIL/uL — ABNORMAL LOW (ref 3.87–5.11)
RDW: 15.3 % (ref 11.5–15.5)

## 2010-10-21 LAB — CROSSMATCH
ABO/RH(D): O POS
Antibody Screen: NEGATIVE
Unit division: 0

## 2010-10-24 ENCOUNTER — Inpatient Hospital Stay (HOSPITAL_COMMUNITY)
Admission: EM | Admit: 2010-10-24 | Discharge: 2010-10-24 | Disposition: A | Discharge status: Home / Self Care | Payer: Self-pay | Source: Ambulatory Visit | Attending: Obstetrics and Gynecology | Admitting: Obstetrics and Gynecology

## 2010-10-24 DIAGNOSIS — Z4802 Encounter for removal of sutures: Secondary | ICD-10-CM | POA: Insufficient documentation

## 2010-10-26 NOTE — Op Note (Addendum)
NAME:  Lori Vaughan, Lori Vaughan NO.:  [id number]  MEDICAL RECORD NO.:  [id number]  LOCATION:                                 FACILITY:  PHYSICIAN:  Lesly Dukes, M.D. DATE OF BIRTH:  10-Sep-1980  DATE OF PROCEDURE: DATE OF DISCHARGE:                              OPERATIVE REPORT   PREOPERATIVE DIAGNOSIS:  This patient is a gravida 5, para 3-0-1-3 who presented at 93 weeks' estimated gestational age for a repeat cesarean section and undesired fertility.  POSTOPERATIVE DIAGNOSIS:  This patient is a gravida 5, para 3-0-1-3 who presented at 41 weeks' estimated gestational age for a repeat cesarean section and undesired fertility.  PROCEDURE:  Repeat low transverse C-section with a vertical skin approach with bilateral tubal ligation using Filshie clips.  SURGEON:  Lesly Dukes, MD  ASSISTANTS: 1. Lucina Mellow, DO 2. Everrett Coombe, MD  ANESTHESIA:  Spinal.  FINDINGS:  A viable infant female in vertex position.  Apgars of 8 and 9.  Cord pH of 7.34.  Infant weight 7 pounds and 13 ounces.  Normal uterus with a thin lower uterine segment.  A normal placenta.  Normal tubes, normal ovaries.  SPECIMENS:  Placenta to Labor and Delivery.  Cord blood sample and cord blood pH.  ESTIMATED BLOOD LOSS:  600.  URINE OUTPUT:  400 and clear.  FLUIDS:  3000.  PROCEDURE:  The patient was taken to the operating room where spinal anesthesia was found to be adequate.  She was prepared and draped in a normal sterile fashion in the dorsal supine position with a leftward tilt.  A vertical skin incision was made over the old skin incision from the umbilicus to just above the pubic bone with the scalpel and carried through to the underlying fascia with the Bovie.  The fascia was then incised and this incision was extended superiorly and inferiorly with the use of a Kelly and the Bovie.  The rectus muscle were separated in the  midline.  The peritoneal cavity was entered  and the uterus was identified.  The bladder blade was inserted and the vesicouterine peritoneum was identified, grasped with pickups, and entered sharply with the Metzenbaum scissors.  The incision was extended laterally with the use of fingers to create a digital bladder flap.  The bladder blade was then removed.  An Alexis retractor was placed and the bladder blade was replaced again.  The lower uterine segment was incised in a transverse fashion with a scalpel.  The uterine incision was then extended laterall and cephaled with blunt dissection.  The bladder blade was removed and the infant's head was delivered atraumatically followed by the remainder of the infant.  The nose and the mouth were suctioned on the table.  The cord was clamped and cut.  The infant was handed to the awaiting pediatricians.  A cord blood sample was obtained. Cord gas sample was also obtained.  The placenta was then removed with traction and external uteine massage. The uterus was cleared of all clots and debris.  The uterine incision was repaired with Vicryl in a running locked fashion.  The gutters were cleared of all clots and debris.  Attention was then turned to the patient's left ovary  and fallopian tube.  The fallopian tube was identified and followed out to the fimbria, was then held with two Babcock clamps to isolate the proximal third of the fallopian tube and a Filshie clip was placed.  Hemostasis was noted and the fallopian tube was returned to the abdomen.  Attention was then turned to the patient's right adnexa and again the fallopian tube was identified and followed out to the fimbria, was held with Babcock clamps and a Filshie clip was placed.  Hemostasis was noted to be obtained and this was returned to the abdomen.  The uterus was noted to be hemostatic one last time.  The fascia was reapproximated with loop 0- PDS.  The skin was closed with staples.  The patient tolerated the procedure  well.  Sponge, lap, and needle counts were correct x3.  Two grams of Ancef were given prior to the procedure.  The patient was taken to the recovery room in stable condition.    ______________________________ Lucina Mellow, DO   ______________________________ Lesly Dukes, M.D.    SH/MEDQ  D:  10/17/2010  T:  10/18/2010  Job:  [id number]  Electronically Signed by Elsie Lincoln M.D. on 10/26/2010 09:17:21 PM Electronically Signed by Lucina Mellow MD on 10/30/2010 10:04:41 AM

## 2010-11-28 ENCOUNTER — Ambulatory Visit: Payer: Self-pay | Admitting: Family Medicine

## 2010-12-18 ENCOUNTER — Encounter: Payer: Self-pay | Admitting: Family Medicine

## 2010-12-18 ENCOUNTER — Ambulatory Visit (INDEPENDENT_AMBULATORY_CARE_PROVIDER_SITE_OTHER): Payer: Self-pay | Admitting: Family Medicine

## 2010-12-18 VITALS — BP 128/77 | HR 111 | Wt 148.0 lb

## 2010-12-18 DIAGNOSIS — Z3490 Encounter for supervision of normal pregnancy, unspecified, unspecified trimester: Secondary | ICD-10-CM

## 2010-12-18 DIAGNOSIS — Z3480 Encounter for supervision of other normal pregnancy, unspecified trimester: Secondary | ICD-10-CM

## 2010-12-18 DIAGNOSIS — R7611 Nonspecific reaction to tuberculin skin test without active tuberculosis: Secondary | ICD-10-CM

## 2010-12-19 ENCOUNTER — Ambulatory Visit (HOSPITAL_COMMUNITY)
Admission: RE | Admit: 2010-12-19 | Discharge: 2010-12-19 | Disposition: A | Discharge status: Home / Self Care | Payer: Self-pay | Source: Ambulatory Visit | Attending: Family Medicine | Admitting: Family Medicine

## 2010-12-19 ENCOUNTER — Telehealth: Payer: Self-pay | Admitting: *Deleted

## 2010-12-19 DIAGNOSIS — R7611 Nonspecific reaction to tuberculin skin test without active tuberculosis: Secondary | ICD-10-CM | POA: Insufficient documentation

## 2010-12-19 NOTE — Telephone Encounter (Signed)
Called pt in re: CXR. Pt did not go for CXR yet. I need to fax the results to the [facility], pt had positive PPD. Explained to pt and she will go for her x-ray today. Fwd. To Dr.Matthews for info .Arlyss Repress

## 2010-12-19 NOTE — Telephone Encounter (Signed)
Faxed info and report to the health department [phone]. They will call pt. I also called the health department and left message for the 'tb nurse' .Lori Vaughan

## 2010-12-23 ENCOUNTER — Encounter: Payer: Self-pay | Admitting: Family Medicine

## 2010-12-23 NOTE — Progress Notes (Signed)
  Subjective:    Patient ID: Lori Vaughan, female    DOB: June 22, 1980, 30 y.o.   MRN: [id number]  HPI Here for post-partum follow up, s/p repeat C/S with BTL.  Doing well since delivery.  Feels like wound is healed well, denies pain.  No lochia or vaginal discharge. Not breastfeeding. Happy to have baby, denies any depressed mood.  Father is helping out quite a bit taking care of newborn and other children.  Feels very well supported.  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  Denies cough, fever, chills, significant weight loss.  Has had negative CXR in the past, no treatment of TB.  Does not recall history of BCG vaccine   Review of Systems Denies abdominal pain, fever, chills, bleeding.    Objective:   Physical Exam  Constitutional: She is oriented to person, place, and time. She appears well-developed and well-nourished. No distress.  Cardiovascular: Normal rate, regular rhythm and normal heart sounds.   Pulmonary/Chest: Effort normal and breath sounds normal.  Abdominal: Soft. Bowel sounds are normal.       Midline scar is well healed.  No signs of dehiscence.  No drainage.  Neurological: She is alert and oriented to person, place, and time.  Psychiatric: She has a normal mood and affect. Her behavior is normal. Judgment and thought content normal.          Assessment & Plan:

## 2010-12-23 NOTE — Assessment & Plan Note (Signed)
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  Does have history of positive PPD with negative CXR.  No signs or symptoms of active infection.  Will send for CXR and refer to [facility].

## 2010-12-23 NOTE — Assessment & Plan Note (Signed)
Here for post-partum follow up.  Incision healed well. No complaints.   No signs of post partum depression

## 2013-01-13 IMAGING — US US OB COMP +14 WK
1 series · 12 of 28 positions shown · non-contrast
Comparison: none

[Series 1: us ob comp +14 wk · 12 of 92 slices shown]
[im 4/92]
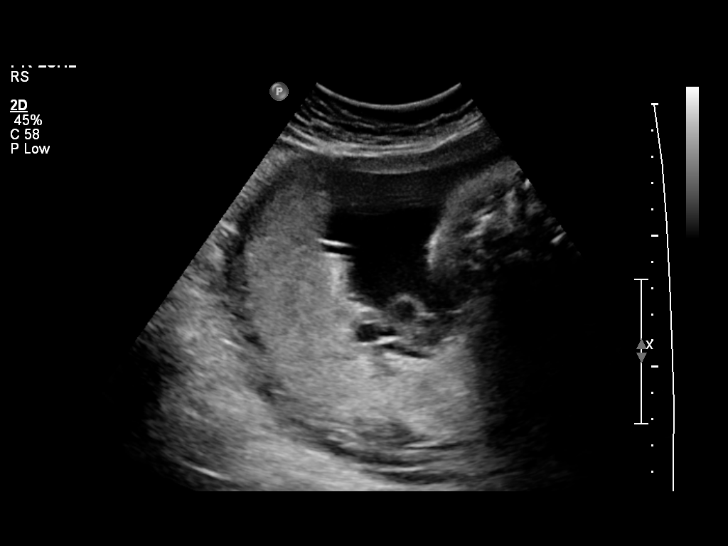
[im 11/92]
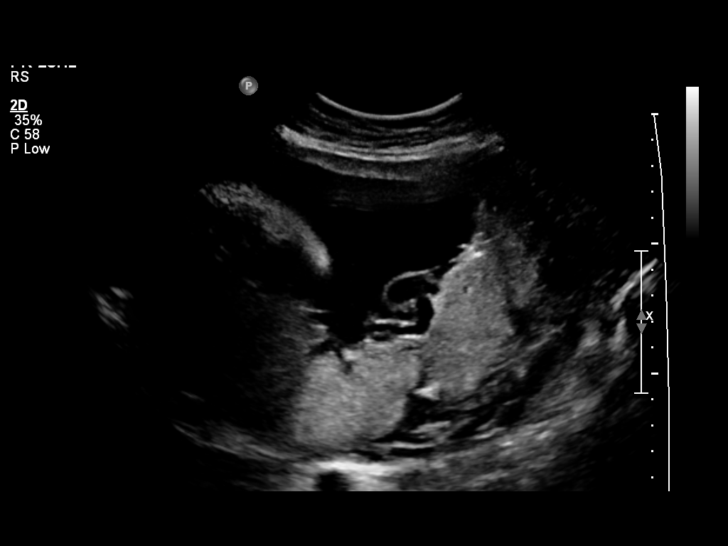
[im 17/92]
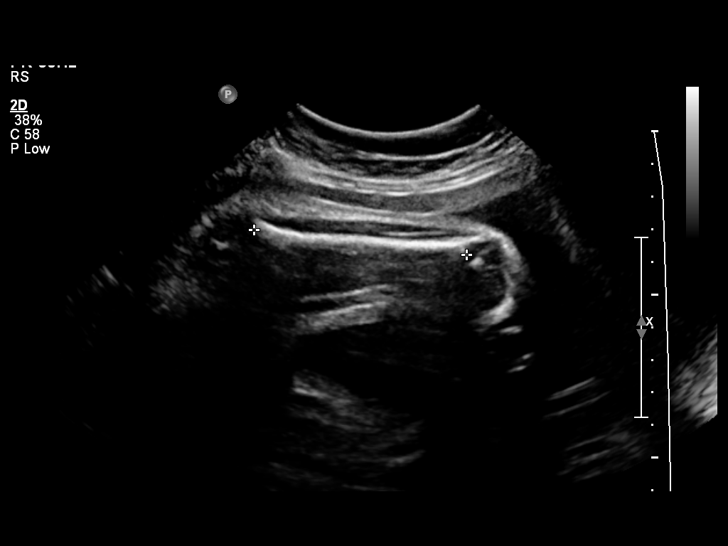
[im 27/92]
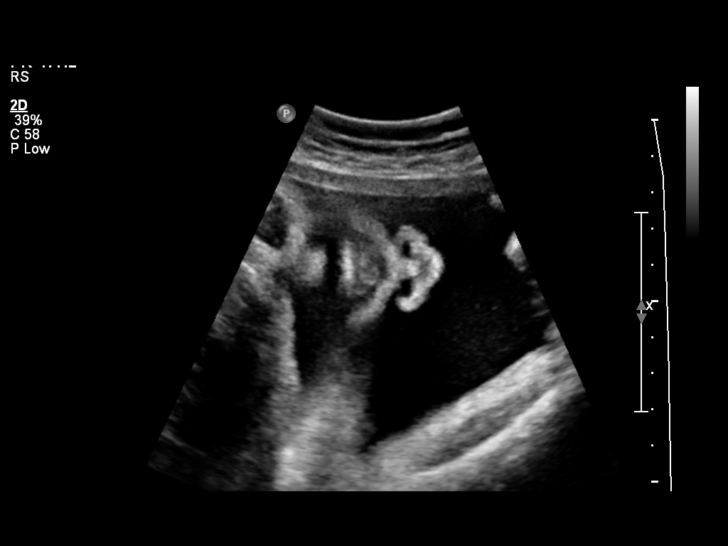
[im 34/92]
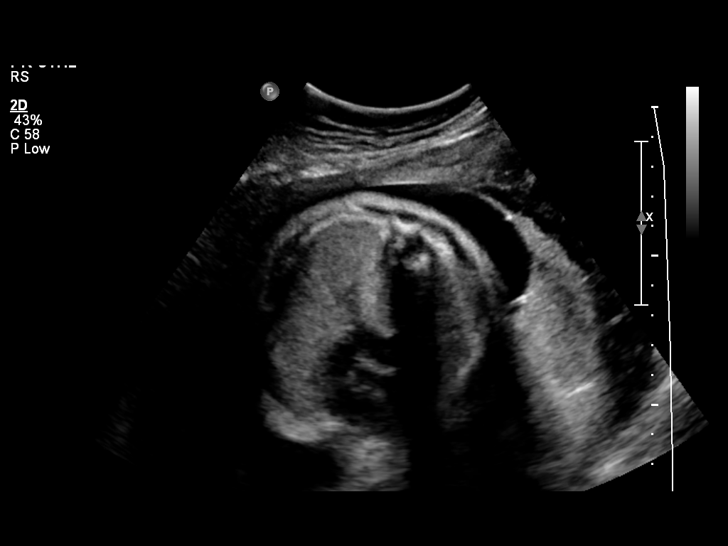
[im 41/92]
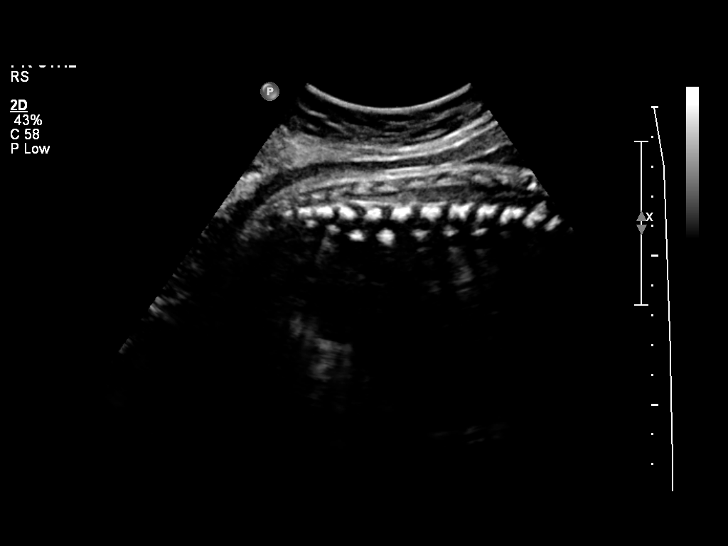
[im 51/92]
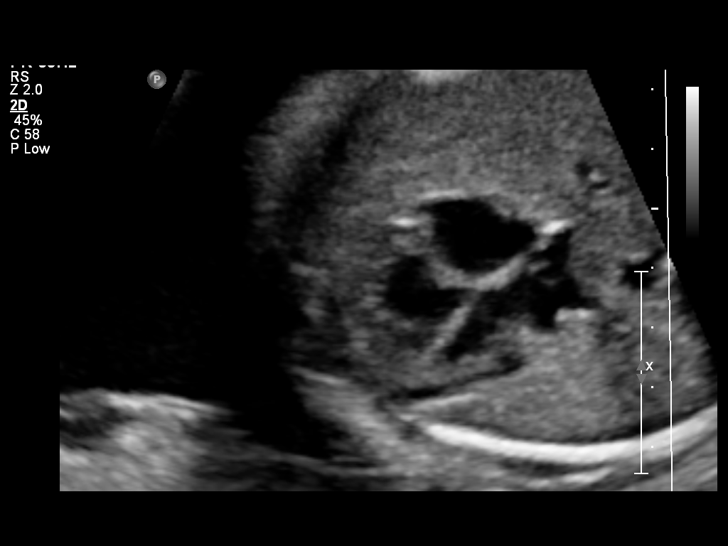
[im 58/92]
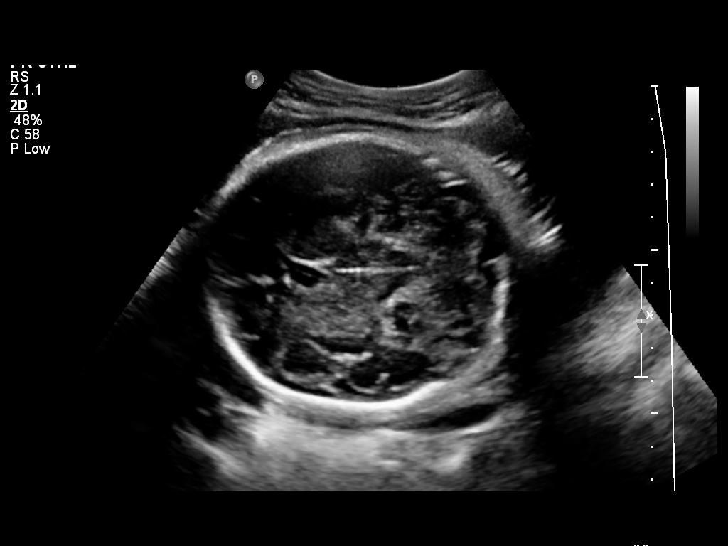
[im 65/92]
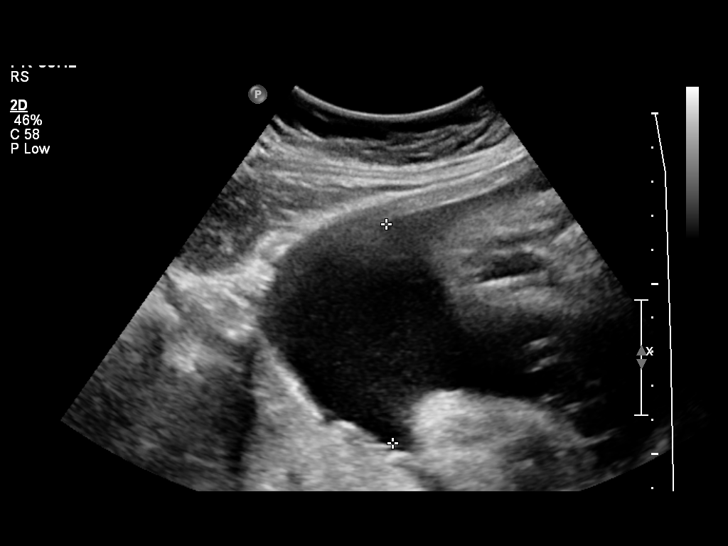
[im 75/92]
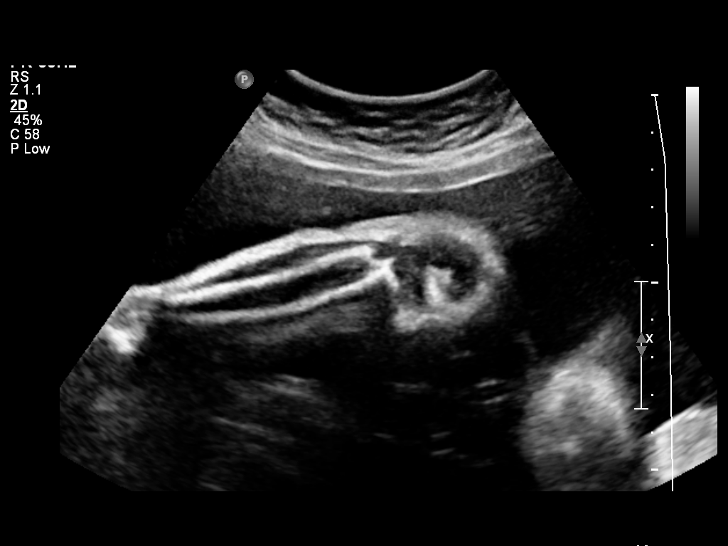
[im 81/92]
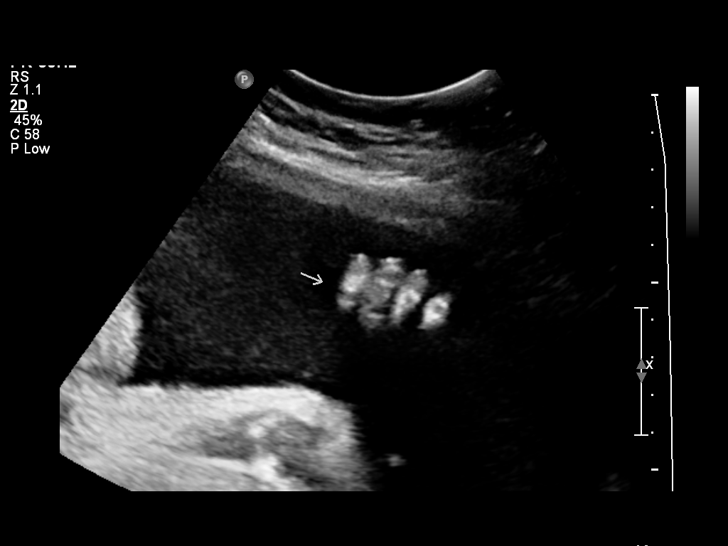
[im 88/92]
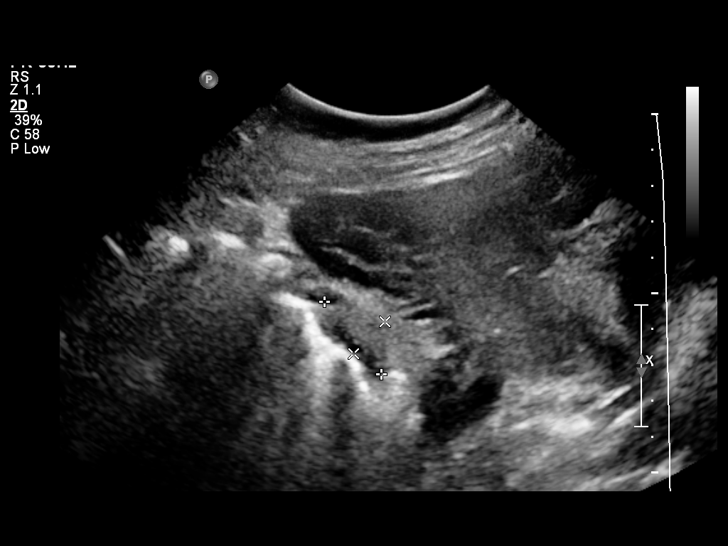

[12 of 28 positions shown; findings below may reference images not displayed]

OBSTETRICS REPORT
                      (Signed Final 08/29/2010 [DATE])

           [facility]

 Order#:         [id number]
Procedures

 US OB COMP + 14 WK                                    76805.1
Indications

 Basic anatomic survey
 Unsure of LMP;  Establish Gestational [AGE]
Fetal Evaluation

 Fetal Heart Rate:  161                          bpm
 Cardiac Activity:  Observed
 Presentation:      Cephalic
 Placenta:          Posterior Fundal, above
                    cervical os
 P. Cord            Appears WNL
 Insertion:

 Amniotic Fluid
 AFI FV:      Subjectively upper-normal
 AFI Sum:     24.82   cm       95  %Tile     Larg Pckt:    7.23  cm
 RUQ:   6.4     cm   RLQ:    4.67   cm    LUQ:   6.52    cm   LLQ:    7.23   cm
Biometry

 BPD:     84.3  mm     G. Age:  33w 6d                CI:        74.04   70 - 86
                                                      FL/HC:      21.0   19.4 -

 HC:     311.1  mm     G. Age:  34w 6d       37  %    HC/AC:      1.08   0.96 -

 AC:     288.9  mm     G. Age:  32w 6d       26  %    FL/BPD:     77.6   71 - 87
 FL:      65.4  mm     G. Age:  33w 5d       36  %    FL/AC:      22.6   20 - 24

 Est. FW:    8858  gm    4 lb 13 oz      50  %
Gestational Age

 U/S Today:     33w 6d                                        EDD:   10/11/10
 Best:          33w 6d     Det. By:  U/S (08/29/10)           EDD:   10/11/10
Anatomy
 Cranium:           Appears normal      Aortic Arch:       Basic anatomy
                                                           exam per order
 Fetal Cavum:       Appears normal      Ductal Arch:       Basic anatomy
                                                           exam per order
 Ventricles:        Appears normal      Diaphragm:         Appears normal
 Choroid Plexus:    Appears normal      Stomach:           Appears
                                                           normal, left
                                                           sided
 Cerebellum:        Appears normal      Abdomen:           Appears normal
 Posterior Fossa:   Appears normal      Abdominal Wall:    Appears nml
                                                           (cord insert,
                                                           abd wall)
 Nuchal Fold:       Not applicable      Cord Vessels:      Appears normal
                    (>20 wks GA)                           (3 vessel cord)
 Face:              Appears normal      Kidneys:           Appear normal
                    (lips/profile/orbit
                    s)
 Heart:             Appears normal      Bladder:           Appears normal
                    (4 chamber &
                    axis)
 RVOT:              Basic anatomy       Spine:             Appears normal
                    exam per order
 LVOT:              Appears normal      Limbs:             Four extremities
                                                           visualized
                                                           (basic anatomy
                                                           exam)

 Other:     Female gender. Nasal bone visualized.
Cervix Uterus Adnexa

 Cervical Length:    3.1      cm

 Cervix:       Normal appearance by transabdominal scan.

 Left Ovary:    Within normal limits.
 Right Ovary:   Within normal limits.
 Adnexa:     No abnormality visualized.
Impression

 Single living IUP with US Gest. Age of 33w 6d, and EDD of
 10/11/2010.
 No fetal anomalies seen involving visualized anatomy.
 Amniotic fluid in upper-normal range, with AFI of 24.82 cm.
 Normal cervical length.

 FATTORI with us.  Please do not hesitate to contact

## 2013-05-05 IMAGING — CR DG CHEST 2V
2 series · 2 of 2 positions shown · non-contrast
Comparison: None

CLINICAL DATA: Positive PPD.

CHEST - 2 VIEW

[w chest pa]
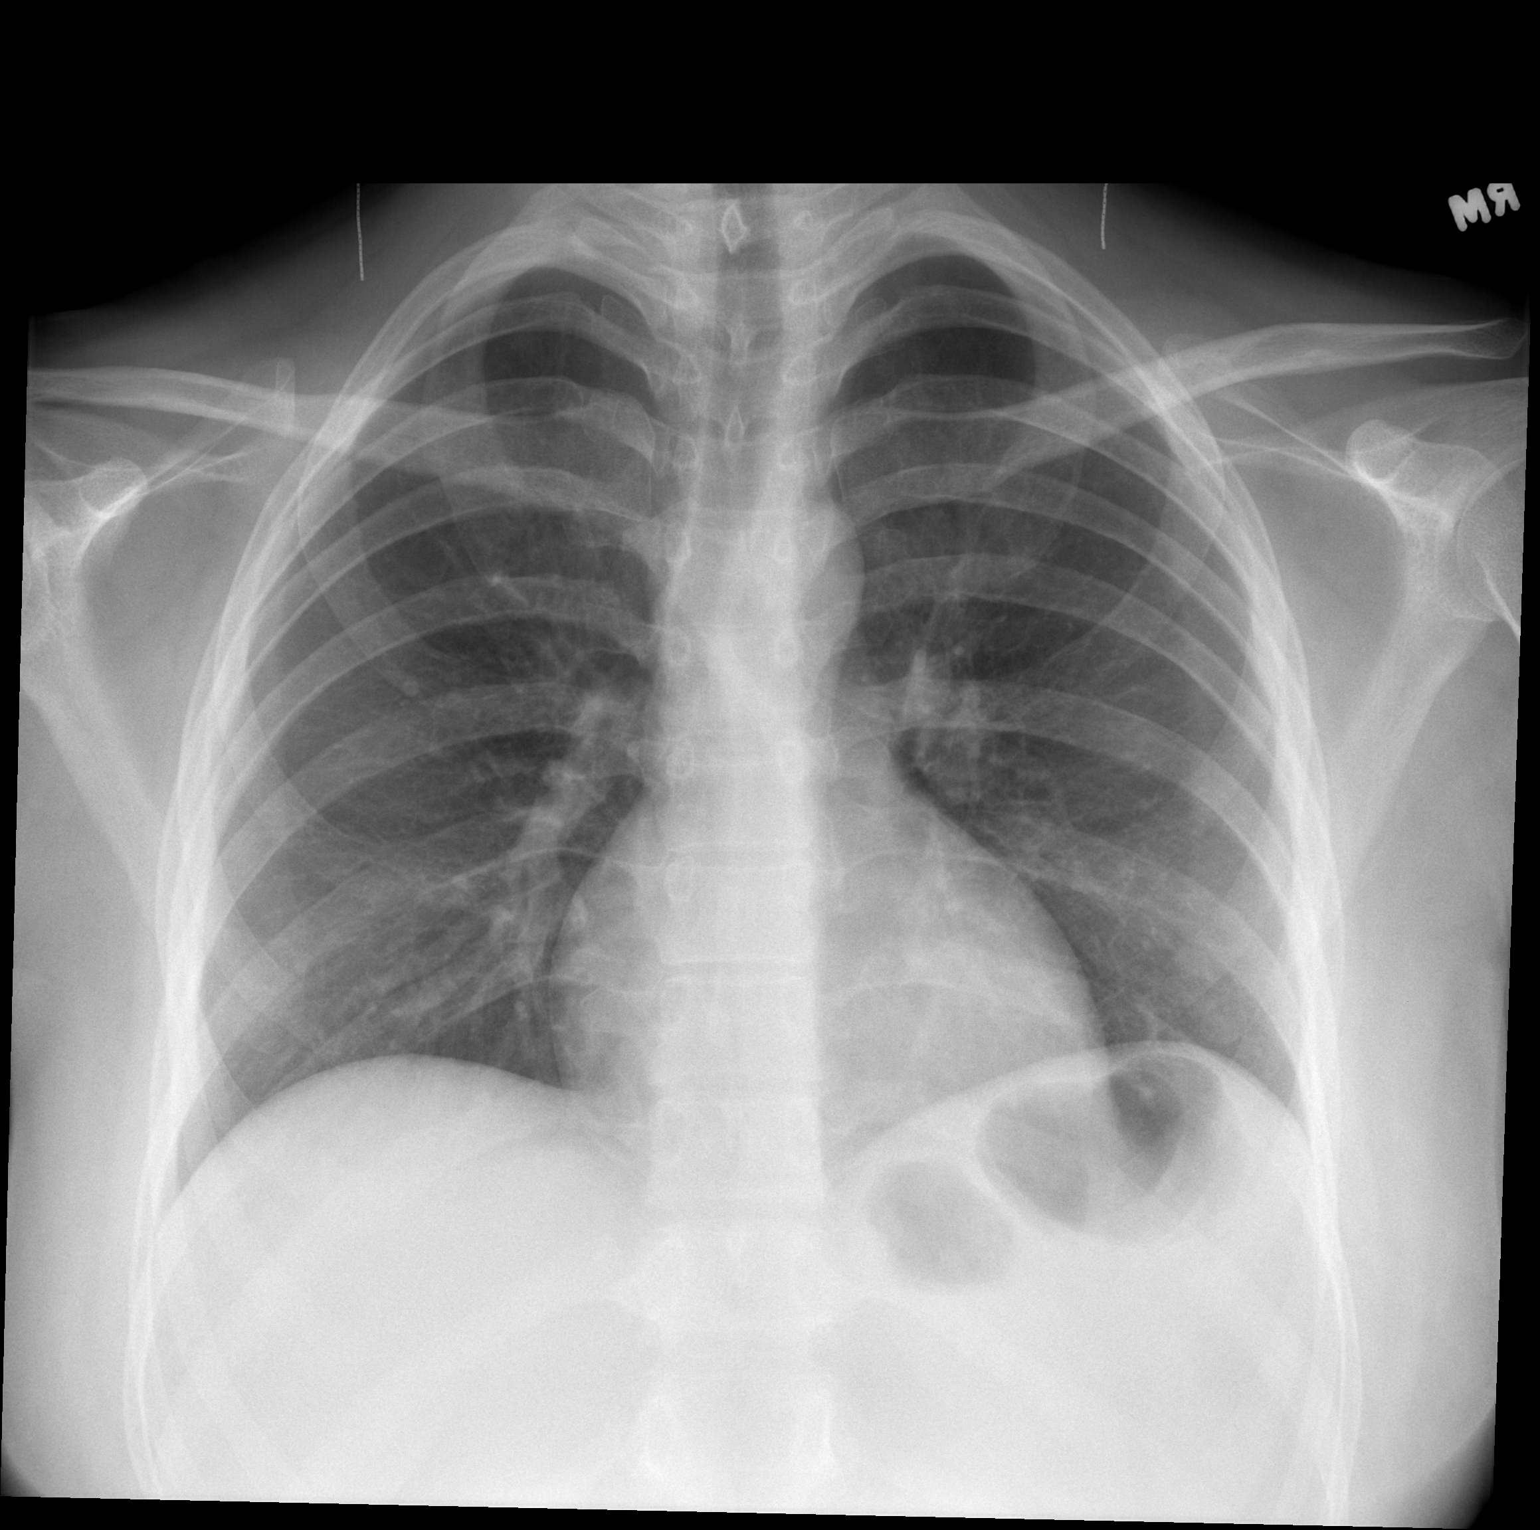

[w chest lat]
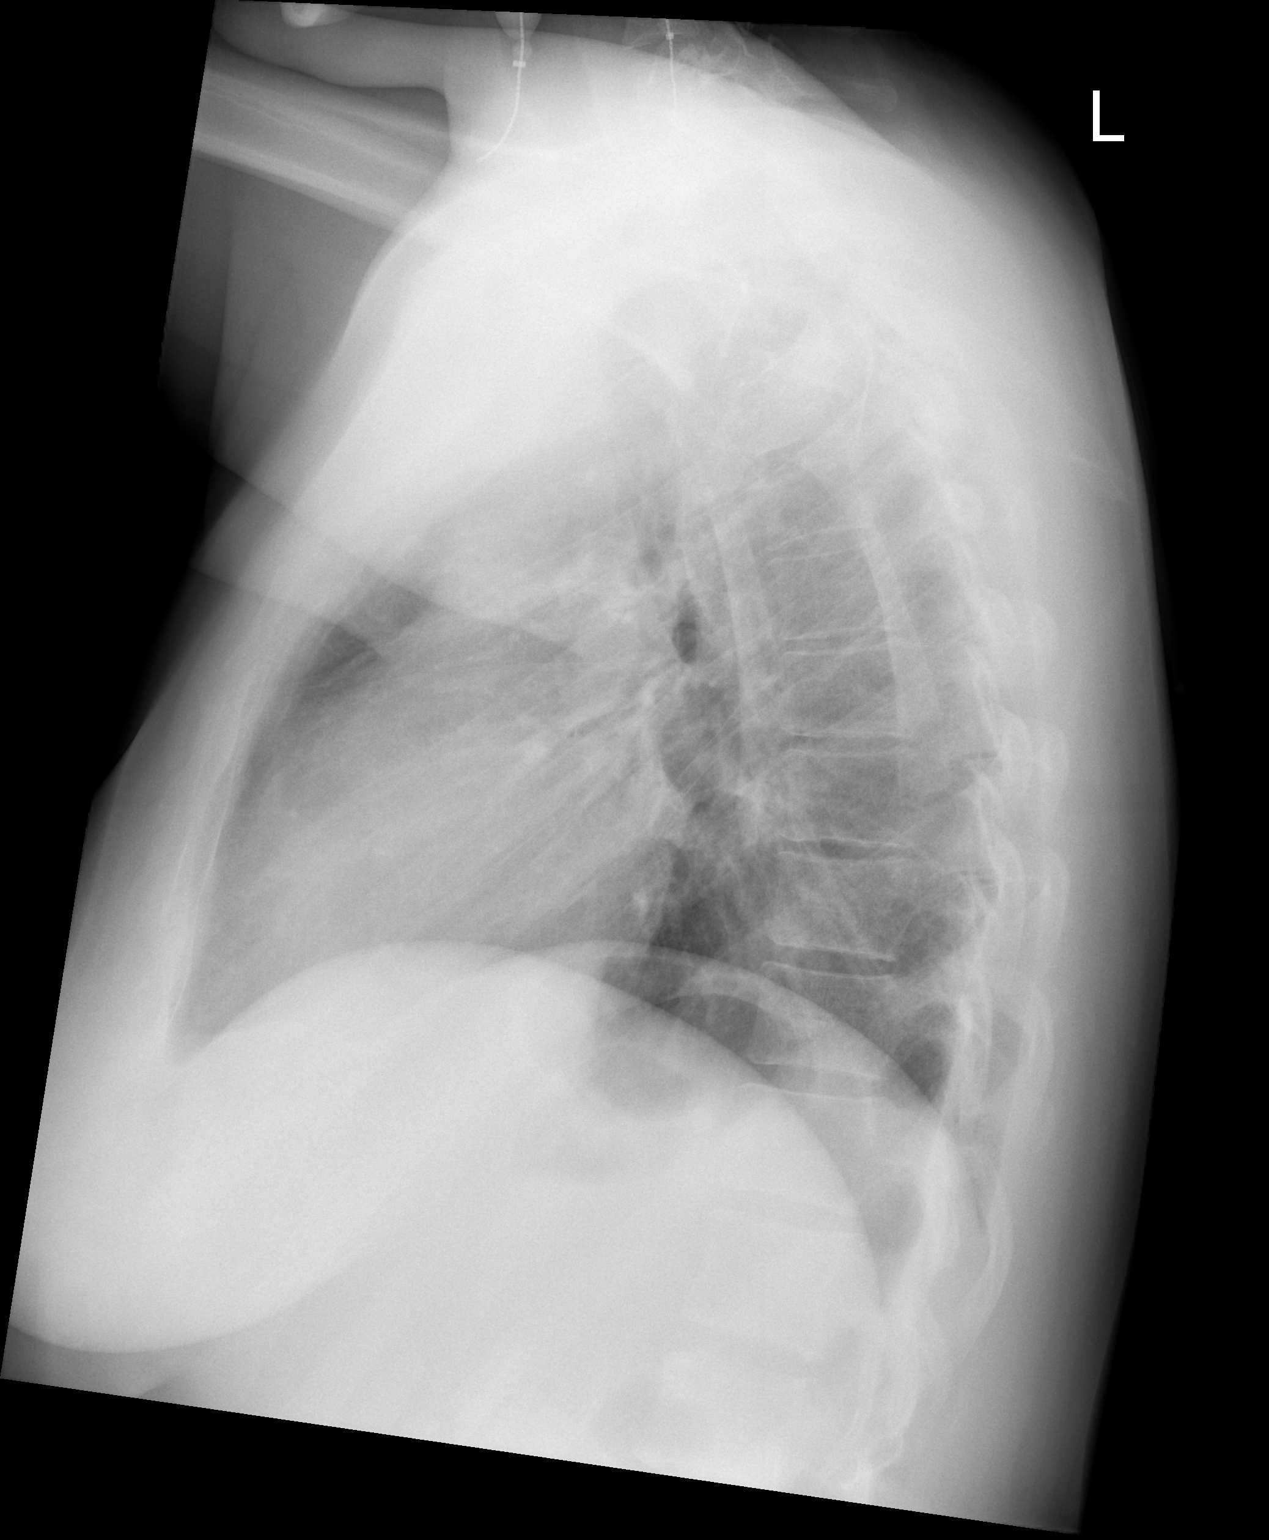

[2 of 2 positions shown; findings below may reference images not displayed]

FINDINGS: Heart and mediastinal contours are within normal limits.
No focal opacities or effusions.  No acute bony abnormality.
IMPRESSION: No active disease.

## 2014-02-19 ENCOUNTER — Encounter: Payer: Self-pay | Admitting: Family Medicine

## 2014-06-25 ENCOUNTER — Ambulatory Visit (INDEPENDENT_AMBULATORY_CARE_PROVIDER_SITE_OTHER): Payer: Self-pay | Admitting: Family Medicine

## 2014-06-25 ENCOUNTER — Encounter: Payer: Self-pay | Admitting: Family Medicine

## 2014-06-25 DIAGNOSIS — N898 Other specified noninflammatory disorders of vagina: Secondary | ICD-10-CM

## 2014-06-25 DIAGNOSIS — R7611 Nonspecific reaction to tuberculin skin test without active tuberculosis: Secondary | ICD-10-CM

## 2014-06-25 DIAGNOSIS — N760 Acute vaginitis: Secondary | ICD-10-CM | POA: Insufficient documentation

## 2014-06-25 DIAGNOSIS — L298 Other pruritus: Secondary | ICD-10-CM

## 2014-06-25 LAB — POCT WET PREP (WET MOUNT)
CLUE CELLS WET PREP WHIFF POC: NEGATIVE
WBC, Wet Prep HPF POC: <5

## 2014-06-25 MED ORDER — TERCONAZOLE 0.4 % VA CREA: 1.0000 | TOPICAL_CREAM | Freq: Every day | VAGINAL | Status: DC

## 2014-06-25 NOTE — Patient Instructions (Addendum)
It was nice to meet you. I will call you with the results of the test we ran today later this morning. From your exam, it looks like you have a vaginal yeast infection which can be very itchy and bothersome. I have sent a prescription for Terazole vaginal cream to your pharmacy, if this is too expensive, please get Monistat 3 day or 7 day vaginal cream. Be sure to put some of the cream on your vulva (the area that is very itchy and red) with each application. If things do not improve in the next 2 days or they worsen acutely, please come back in to be evaluated.  Please meet with Britta MccreedyBarbara and drop off your paperwork very soon. Please make a follow up once you have medical coverage so that you can get a pap smear (to look for cervical cancer).

## 2014-06-25 NOTE — Progress Notes (Signed)
Patient ID: Lori RyderVeronica Vaughan, female   DOB: 12/20/1980, 34 y.o.   MRN: [id number] 34 y.o. year old female presents to establish care and obtain a physical exam   Acute Concerns:  1. Vaginal pruritus: Patient has had vaginal itching x 3 months that is very bothersome. The itching has been the same. White discharge that is not malodorous. Vulva is very erythematous. No bumps. She bought something for itching but this has itched. No dysuria, urinary frequency, or urinary urgency. No h/o STDs. Patient does not have insurance currently and would like to defer STD testing at this time.   LMP: Apr 15, 2014, patient notes menses has not been regular since BTL in 2012 (will go 2-3 months without a period then bleed heavily x 4 days).   Past Medical History Depression- associated with pregnancy 11 years ago. No SI/HI/ AVH currently C-section Positive PPD in 2012, negative CXR. 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   Medications Melatonin  Tylenol PRN  Family History  Family history of breast cancer at the age of 350 (2 people in the family)  Thyroid dysfunction  Social:  Married for 4 years. Lives with husband and 4 children Never smoker No drug use No alcohol use   Immunization:  Influenza: Not received, doesn't want today   Cancer Screening:  Pap Smear: 05/2100: negative for intraepithelial lesion or malignancy   Mammogram: N/A  Colonoscopy: N/A   Dexa: N/A   Physical Exam: Blood pressure 124/81, pulse 80, temperature 97.9 F (36.6 C), temperature source Oral, height 5' 4.17" (1.63 m), weight 166 lb (75.297 kg), last menstrual period 04/15/2014, unknown if currently breastfeeding. GEN: Sitting up in NAD. Occasionally crossing her legs uncomfortably HEENT: Atraumatic. Sclera anicteric. PEERL. Difficult to visual complete TMs due to cerumen, however non-erythematous, non-bulging. MMM,  oropharynx clear.  No LAD or thyromegaly. CARDIAC: RRR, no m/r/g noted. 2+ peripheral pulses RESP: No increased WOB, CTAB without wheezing, rhonchi, or crackles.  ABD: +BS. Soft, NT/ND.  GU/GYN:Exam performed in the presence of a chaperone. Vulva erythematous, mostly at the superior aspect near the apex.  Vaginal mucosa pink, moist, normal rugae.  Minimal thin white vaginal discharge that is not malodorous. Nonfriable cervix without lesions or bleeding noted on speculum exam. No cervical motion tenderness. No adnexal masses bilaterally. EXT: Normal bulk and tone. 5/5 strength in the UL and LE bilaterally. No LE edema  Normal patellar reflexes  SKIN: No rashes noted.    ASSESSMENT & PLAN: 34 y.o. female presents to establish care and obtain a physical, however preferred to wait on health maintenance until medical coverage is obtained after meeting with Britta MccreedyBarbara.  Please see problem specific assessment and plan.

## 2014-06-25 NOTE — Assessment & Plan Note (Signed)
Vulva erythematous with minimal discharge noted. No lesions noted. Patient's history and PE consistent with candidiasis infection.  - Will treat empirically with terazol x 7 days - Discussed and written instructions that if this is too expensive, patient can obtain Monistat 3 or 7 day cream OTC. - Wet prep performed and pending; will call patient with update - Patient preferred to defer STD testing and cervical screening until medical coverage obtained.

## 2014-08-19 DIAGNOSIS — IMO0002 Reserved for concepts with insufficient information to code with codable children: Secondary | ICD-10-CM

## 2014-08-19 DIAGNOSIS — R87612 Low grade squamous intraepithelial lesion on cytologic smear of cervix (LGSIL): Secondary | ICD-10-CM | POA: Insufficient documentation

## 2014-08-19 DIAGNOSIS — R87619 Unspecified abnormal cytological findings in specimens from cervix uteri: Secondary | ICD-10-CM

## 2014-08-19 HISTORY — DX: Reserved for concepts with insufficient information to code with codable children: IMO0002

## 2014-08-19 HISTORY — DX: Unspecified abnormal cytological findings in specimens from cervix uteri: R87.619

## 2014-10-03 ENCOUNTER — Encounter (HOSPITAL_COMMUNITY): Payer: Self-pay | Admitting: *Deleted

## 2014-10-04 ENCOUNTER — Encounter (HOSPITAL_COMMUNITY): Payer: Self-pay

## 2014-10-04 ENCOUNTER — Ambulatory Visit (HOSPITAL_COMMUNITY)
Admission: RE | Admit: 2014-10-04 | Discharge: 2014-10-04 | Disposition: A | Discharge status: Home / Self Care | Payer: Self-pay | Source: Ambulatory Visit | Attending: Obstetrics and Gynecology | Admitting: Obstetrics and Gynecology

## 2014-10-04 VITALS — BP 110/68 | Temp 98.3°F | Ht 63.0 in | Wt 165.0 lb

## 2014-10-04 DIAGNOSIS — Z803 Family history of malignant neoplasm of breast: Secondary | ICD-10-CM | POA: Insufficient documentation

## 2014-10-04 DIAGNOSIS — Z1239 Encounter for other screening for malignant neoplasm of breast: Secondary | ICD-10-CM

## 2014-10-04 DIAGNOSIS — R87612 Low grade squamous intraepithelial lesion on cytologic smear of cervix (LGSIL): Secondary | ICD-10-CM

## 2014-10-04 DIAGNOSIS — Z1231 Encounter for screening mammogram for malignant neoplasm of breast: Secondary | ICD-10-CM | POA: Insufficient documentation

## 2014-10-04 NOTE — Progress Notes (Signed)
CLINIC:  [facility] [employer]  REASON FOR VISIT: Well-woman exam   HISTORY OF PRESENT ILLNESS:  Ms. Lori Vaughan is a 34 y.o. female who presents to the [facility] today for clinical breast exam. She has a family history of breast cancer in her maternal aunt. She has no complaints today.  Her last pap smear was on 08/29/14 and showed LSIL and HPV (+).  This was her first abnormal pap smear.  She is scheduled for colposcopy on 10/05/14.   REVIEW OF SYSTEMS:  Denies any breast pain, nodularity, nipple inversion, or nipple discharge bilaterally.   ALLERGIES: No Known Allergies  CURRENT MEDICATIONS:  Current Outpatient Prescriptions on File Prior to Encounter  Medication Sig Dispense Refill  . Melatonin 1 MG CAPS Take 1 mg by mouth.    . terconazole (TERAZOL 7) 0.4 % vaginal cream Place 1 applicator vaginally at bedtime. (Patient not taking: Reported on 10/04/2014) 45 g 0   No current facility-administered medications on file prior to encounter.     PHYSICAL EXAM:  Vitals:  Filed Vitals:   10/04/14 1226  BP: 110/68  Temp: 98.3 F (36.8 C)   General: Well-nourished, well-appearing female in no acute distress.  She is unaccompanied in clinic today.  Stoney Bang, LPN and Milas Gain, Spanish language interpreter were present during physical exam for this patient.  Breasts: Bilateral breasts exposed and observed with patient standing (arms at side, arms on hips, arms on hips flexed forward, and arms over head).  No gross abnormalities including breast skin puckering or dimpling noted on observation.  Breasts symmetrical without evidence of skin redness, thickening, or peau d'orange appearance. No nipple retraction or nipple discharge noted bilaterally.  No breast nodularity palpated in bilateral breasts.  Normal fibrocystic breast changes noted in bilateral breasts. Axillary lymph nodes: No axillary lymphadenopathy bilaterally.   GU: Exam deferred.  Pap smear is up-to-date, colposcopy pending.  ASSESSMENT & PLAN:   1. Breast cancer screening: Ms. Lori Vaughan has no palpable breast abnormalities on her clinical breast exam today.  She will receive her screening mammogram as scheduled.  She will be contacted by the imaging center for results of the mammogram, either by letter or phone within the next few weeks.  She was given instructions and educational materials regarding breast self-awareness. Ms. Lori Vaughan is aware of this plan and agrees with it.   2. Cervical cancer screening:  Ms. Lori Vaughan will receive colposcopy on 10/05/14 as scheduled.  We reviewed the procedure and potential side effects with the patient today.    Ms. Lori Vaughan was encouraged to ask questions and all questions were answered to her satisfaction.    Lubertha Basque, NP [facility]  [phone]

## 2014-10-05 ENCOUNTER — Encounter: Payer: Self-pay | Admitting: Obstetrics & Gynecology

## 2014-10-05 ENCOUNTER — Other Ambulatory Visit (HOSPITAL_COMMUNITY)
Admission: RE | Admit: 2014-10-05 | Discharge: 2014-10-05 | Disposition: A | Discharge status: Home / Self Care | Payer: Self-pay | Source: Ambulatory Visit | Attending: Obstetrics & Gynecology | Admitting: Obstetrics & Gynecology

## 2014-10-05 ENCOUNTER — Ambulatory Visit (INDEPENDENT_AMBULATORY_CARE_PROVIDER_SITE_OTHER): Payer: Self-pay | Admitting: Obstetrics & Gynecology

## 2014-10-05 VITALS — BP 120/72 | HR 76 | Temp 98.4°F | Ht 65.0 in | Wt 163.7 lb

## 2014-10-05 DIAGNOSIS — Z01812 Encounter for preprocedural laboratory examination: Secondary | ICD-10-CM

## 2014-10-05 DIAGNOSIS — R896 Abnormal cytological findings in specimens from other organs, systems and tissues: Secondary | ICD-10-CM

## 2014-10-05 DIAGNOSIS — IMO0002 Reserved for concepts with insufficient information to code with codable children: Secondary | ICD-10-CM

## 2014-10-05 LAB — POCT PREGNANCY, URINE: PREG TEST UR: NEGATIVE

## 2014-10-05 NOTE — Progress Notes (Signed)
   Subjective:    Patient ID: Lori Vaughan, female    DOB: February 10, 1981, 34 y.o.   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  Review of Systems She had a BTL in the past.    Objective:   Physical Exam  UPT negative, consent signed, time out done Cervix prepped with acetic acid. Transformation zone seen in its entirety. Colpo adequate. 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 She tolerated the procedure well.        Assessment & Plan:  LGSIL pap If her ECC is negative, then pap in 1 year with cotesting

## 2014-10-05 NOTE — Progress Notes (Signed)
Interpreter Tory Emerald- Mordecai Maes

## 2015-08-26 ENCOUNTER — Encounter: Payer: Self-pay | Admitting: Pediatrics

## 2016-01-20 ENCOUNTER — Ambulatory Visit (INDEPENDENT_AMBULATORY_CARE_PROVIDER_SITE_OTHER): Payer: Self-pay | Admitting: Internal Medicine

## 2016-01-20 ENCOUNTER — Encounter: Payer: Self-pay | Admitting: Internal Medicine

## 2016-01-20 VITALS — BP 106/64 | HR 60 | Resp 16 | Ht 63.0 in | Wt 154.0 lb

## 2016-01-20 DIAGNOSIS — Z0000 Encounter for general adult medical examination without abnormal findings: Secondary | ICD-10-CM

## 2016-01-20 DIAGNOSIS — R5383 Other fatigue: Secondary | ICD-10-CM

## 2016-01-20 DIAGNOSIS — D649 Anemia, unspecified: Secondary | ICD-10-CM

## 2016-01-20 DIAGNOSIS — Z124 Encounter for screening for malignant neoplasm of cervix: Secondary | ICD-10-CM

## 2016-01-20 DIAGNOSIS — R87612 Low grade squamous intraepithelial lesion on cytologic smear of cervix (LGSIL): Secondary | ICD-10-CM

## 2016-01-20 LAB — POCT WET PREP WITH KOH
CLUE CELLS WET PREP PER HPF POC: NEGATIVE
KOH Prep POC: NEGATIVE
RBC Wet Prep HPF POC: NEGATIVE
Trichomonas, UA: NEGATIVE
Yeast Wet Prep HPF POC: NEGATIVE

## 2016-01-20 NOTE — Progress Notes (Signed)
   Subjective:    Patient ID: Lori RyderVeronica Romero-Gomez, female    DOB: [birth year], 35 y.o.   MRN: [id number]  HPI   Patient has not been seen here since before switching [facility]   CPE with pap  1.  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    2.  Mammogram:  Never  3.  Osteoprevention:  1 8 oz serving of soy milk daily with cheese and yogurt each once weekly. Would be willing to increase to 4 servings daily.   3 days weekly uses stationary bike/treadmill.    4.  Guaiac Cards:  Never  5.  Colonoscopy:  Never  6.  Immunizations:  Cannot recall last TD/Tdap      Review of Systems  Constitutional: Positive for fatigue.       Poor sleep Difficulty initiating sleep and staying asleep. Lots of worries about children's college education. Problems with family in [place] Has tried exercising before sleep and has tried Melatonin without improvement.  HENT: Positive for dental problem. Negative for hearing loss.        Has dental appt. For cavities  Eyes: Negative for pain and visual disturbance.  Respiratory: Negative for cough and shortness of breath.   Cardiovascular: Negative for chest pain, palpitations and leg swelling.  Gastrointestinal: Negative for abdominal pain, blood in stool, constipation, diarrhea, nausea and vomiting.       No melena  Endocrine: Negative for polyuria.  Genitourinary: Negative for difficulty urinating, dysuria, menstrual problem and vaginal discharge.  Musculoskeletal:       Knees with crepitation at times.  Some discomfort when standing up from deep knee bend. No swelling or redness of knees  Skin: Negative for rash.  Neurological: Negative for speech difficulty, weakness and numbness.  Psychiatric/Behavioral: Positive for dysphoric mood. Negative for self-injury and suicidal ideas. The patient is nervous/anxious.        Objective:   Physical Exam          Assessment & Plan:

## 2016-01-21 LAB — CBC WITH DIFFERENTIAL/PLATELET
BASOS ABS: 0.1 10*3/uL (ref 0.0–0.2)
BASOS: 1 %
EOS (ABSOLUTE): 0.1 10*3/uL (ref 0.0–0.4)
Eos: 1 %
HEMOGLOBIN: 10.2 g/dL — AB (ref 11.1–15.9)
Hematocrit: 33.6 % — ABNORMAL LOW (ref 34.0–46.6)
IMMATURE GRANS (ABS): 0 10*3/uL (ref 0.0–0.1)
Immature Granulocytes: 0 %
LYMPHS ABS: 2.1 10*3/uL (ref 0.7–3.1)
LYMPHS: 41 %
MCH: 21.2 pg — AB (ref 26.6–33.0)
MCHC: 30.4 g/dL — AB (ref 31.5–35.7)
MCV: 70 fL — AB (ref 79–97)
Monocytes Absolute: 0.5 10*3/uL (ref 0.1–0.9)
Monocytes: 10 %
NEUTROS ABS: 2.3 10*3/uL (ref 1.4–7.0)
Neutrophils: 47 %
PLATELETS: 339 10*3/uL (ref 150–379)
RBC: 4.82 x10E6/uL (ref 3.77–5.28)
RDW: 18.1 % — ABNORMAL HIGH (ref 12.3–15.4)
WBC: 5 10*3/uL (ref 3.4–10.8)

## 2016-01-21 LAB — COMPREHENSIVE METABOLIC PANEL
A/G RATIO: 1.1 — AB (ref 1.2–2.2)
ALBUMIN: 4.2 g/dL (ref 3.5–5.5)
ALT: 13 IU/L (ref 0–32)
AST: 21 IU/L (ref 0–40)
Alkaline Phosphatase: 93 IU/L (ref 39–117)
BILIRUBIN TOTAL: 0.5 mg/dL (ref 0.0–1.2)
BUN / CREAT RATIO: 15 (ref 9–23)
BUN: 8 mg/dL (ref 6–20)
CALCIUM: 9.1 mg/dL (ref 8.7–10.2)
CHLORIDE: 102 mmol/L (ref 96–106)
CO2: 23 mmol/L (ref 18–29)
Creatinine, Ser: 0.55 mg/dL — ABNORMAL LOW (ref 0.57–1.00)
GFR, EST AFRICAN AMERICAN: 142 mL/min/{1.73_m2} (ref >59)
GFR, EST NON AFRICAN AMERICAN: 123 mL/min/{1.73_m2} (ref >59)
GLOBULIN, TOTAL: 3.7 g/dL (ref 1.5–4.5)
Glucose: 94 mg/dL (ref 65–99)
POTASSIUM: 4.9 mmol/L (ref 3.5–5.2)
Sodium: 139 mmol/L (ref 134–144)
TOTAL PROTEIN: 7.9 g/dL (ref 6.0–8.5)

## 2016-01-21 LAB — LIPID PANEL W/O CHOL/HDL RATIO
CHOLESTEROL TOTAL: 170 mg/dL (ref 100–199)
HDL: 59 mg/dL (ref >39)
LDL Calculated: 86 mg/dL (ref 0–99)
TRIGLYCERIDES: 124 mg/dL (ref 0–149)
VLDL Cholesterol Cal: 25 mg/dL (ref 5–40)

## 2016-01-21 LAB — TSH: TSH: 1.84 u[IU]/mL (ref 0.450–4.500)

## 2016-01-21 LAB — CYTOLOGY - PAP

## 2016-01-23 DIAGNOSIS — D649 Anemia, unspecified: Secondary | ICD-10-CM | POA: Insufficient documentation

## 2016-01-23 MED ORDER — FERROUS SULFATE 325 (65 FE) MG PO TABS: 325.0000 mg | ORAL_TABLET | Freq: Two times a day (BID) | ORAL | 3 refills | Status: DC

## 2016-01-24 ENCOUNTER — Other Ambulatory Visit: Payer: Self-pay | Admitting: Licensed Clinical Social Worker

## 2016-02-07 ENCOUNTER — Other Ambulatory Visit: Payer: Self-pay | Admitting: Licensed Clinical Social Worker

## 2016-02-11 ENCOUNTER — Ambulatory Visit (INDEPENDENT_AMBULATORY_CARE_PROVIDER_SITE_OTHER): Payer: Self-pay | Admitting: Licensed Clinical Social Worker

## 2016-02-11 DIAGNOSIS — Z1389 Encounter for screening for other disorder: Secondary | ICD-10-CM

## 2016-02-11 DIAGNOSIS — Z1331 Encounter for screening for depression: Secondary | ICD-10-CM
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
## 2016-02-25 ENCOUNTER — Ambulatory Visit (INDEPENDENT_AMBULATORY_CARE_PROVIDER_SITE_OTHER): Payer: Self-pay | Admitting: Licensed Clinical Social Worker

## 2016-02-25 DIAGNOSIS — Z1331 Encounter for screening for depression: Secondary | ICD-10-CM

## 2016-02-25 DIAGNOSIS — Z1389 Encounter for screening for other disorder: Secondary | ICD-10-CM
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
## 2016-03-02 ENCOUNTER — Other Ambulatory Visit (INDEPENDENT_AMBULATORY_CARE_PROVIDER_SITE_OTHER): Payer: Self-pay | Admitting: Internal Medicine

## 2016-03-02 DIAGNOSIS — Z131 Encounter for screening for diabetes mellitus: Secondary | ICD-10-CM

## 2016-03-02 LAB — GLUCOSE, POCT (MANUAL RESULT ENTRY): POC Glucose: 85 mg/dl (ref 70–99)

## 2016-03-10 ENCOUNTER — Ambulatory Visit (INDEPENDENT_AMBULATORY_CARE_PROVIDER_SITE_OTHER): Payer: Self-pay | Admitting: Licensed Clinical Social Worker

## 2016-03-10 DIAGNOSIS — F330 Major depressive disorder, recurrent, mild: Secondary | ICD-10-CM

## 2016-03-10 DIAGNOSIS — F4310 Post-traumatic stress disorder, unspecified: Secondary | ICD-10-CM
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
## 2016-03-24 ENCOUNTER — Ambulatory Visit (INDEPENDENT_AMBULATORY_CARE_PROVIDER_SITE_OTHER): Payer: Self-pay | Admitting: Licensed Clinical Social Worker

## 2016-03-24 DIAGNOSIS — F330 Major depressive disorder, recurrent, mild: Secondary | ICD-10-CM

## 2016-03-24 DIAGNOSIS — F4310 Post-traumatic stress disorder, unspecified: Secondary | ICD-10-CM
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
## 2016-04-25 ENCOUNTER — Telehealth: Payer: Self-pay | Admitting: Internal Medicine

## 2016-04-28 ENCOUNTER — Other Ambulatory Visit: Payer: Self-pay | Admitting: Licensed Clinical Social Worker

## 2016-04-28 NOTE — Telephone Encounter (Signed)
Lori Vaughan left voicemail for patient to come over to the clinic after or before [facility].

## 2016-05-05 ENCOUNTER — Other Ambulatory Visit (INDEPENDENT_AMBULATORY_CARE_PROVIDER_SITE_OTHER): Payer: Self-pay

## 2016-05-05 ENCOUNTER — Ambulatory Visit: Payer: Self-pay | Admitting: Licensed Clinical Social Worker

## 2016-05-05 DIAGNOSIS — R739 Hyperglycemia, unspecified: Secondary | ICD-10-CM

## 2016-05-05 DIAGNOSIS — F4310 Post-traumatic stress disorder, unspecified: Secondary | ICD-10-CM

## 2016-05-05 DIAGNOSIS — F330 Major depressive disorder, recurrent, mild: Secondary | ICD-10-CM

## 2016-05-05 LAB — GLUCOSE, POCT (MANUAL RESULT ENTRY): POC Glucose: 115 mg/dl — AB (ref 70–99)
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
## 2016-05-21 ENCOUNTER — Ambulatory Visit (INDEPENDENT_AMBULATORY_CARE_PROVIDER_SITE_OTHER): Payer: Self-pay | Admitting: Licensed Clinical Social Worker

## 2016-05-21 DIAGNOSIS — F4310 Post-traumatic stress disorder, unspecified: Secondary | ICD-10-CM

## 2016-05-21 DIAGNOSIS — F330 Major depressive disorder, recurrent, mild: Secondary | ICD-10-CM
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
## 2016-06-05 ENCOUNTER — Other Ambulatory Visit: Payer: Self-pay | Admitting: Licensed Clinical Social Worker

## 2016-06-16 ENCOUNTER — Ambulatory Visit (INDEPENDENT_AMBULATORY_CARE_PROVIDER_SITE_OTHER): Payer: Self-pay | Admitting: Licensed Clinical Social Worker

## 2016-06-16 DIAGNOSIS — F330 Major depressive disorder, recurrent, mild: Secondary | ICD-10-CM

## 2016-06-16 DIAGNOSIS — F4310 Post-traumatic stress disorder, unspecified: Secondary | ICD-10-CM
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
## 2016-06-30 ENCOUNTER — Ambulatory Visit (INDEPENDENT_AMBULATORY_CARE_PROVIDER_SITE_OTHER): Payer: Self-pay | Admitting: Licensed Clinical Social Worker

## 2016-06-30 DIAGNOSIS — F4310 Post-traumatic stress disorder, unspecified: Secondary | ICD-10-CM

## 2016-06-30 DIAGNOSIS — F330 Major depressive disorder, recurrent, mild: Secondary | ICD-10-CM
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
## 2018-02-08 ENCOUNTER — Ambulatory Visit: Payer: Self-pay | Attending: Nurse Practitioner | Admitting: Nurse Practitioner

## 2018-02-08 ENCOUNTER — Encounter: Payer: Self-pay | Admitting: Nurse Practitioner

## 2018-02-08 VITALS — BP 109/72 | HR 77 | Temp 98.9°F | Ht 63.0 in | Wt 158.0 lb

## 2018-02-08 DIAGNOSIS — Z818 Family history of other mental and behavioral disorders: Secondary | ICD-10-CM | POA: Insufficient documentation

## 2018-02-08 DIAGNOSIS — F419 Anxiety disorder, unspecified: Secondary | ICD-10-CM | POA: Insufficient documentation

## 2018-02-08 DIAGNOSIS — G8929 Other chronic pain: Secondary | ICD-10-CM

## 2018-02-08 DIAGNOSIS — Z0000 Encounter for general adult medical examination without abnormal findings: Secondary | ICD-10-CM

## 2018-02-08 DIAGNOSIS — M545 Low back pain: Secondary | ICD-10-CM

## 2018-02-08 MED ORDER — IBUPROFEN 800 MG PO TABS: 800.0000 mg | ORAL_TABLET | Freq: Three times a day (TID) | ORAL | 1 refills | Status: AC | PRN

## 2018-02-08 MED FILL — IBUPROFEN 800 MG TABLET: 800 | 20 days supply | Qty: 60 | Fill #0

## 2018-02-08 NOTE — Progress Notes (Signed)
 Assessment & Plan:  Lori Vaughan was seen today for new patient (initial visit).  Diagnoses and all orders for this visit:  Chronic midline low back pain without sciatica -     ibuprofen (ADVIL,MOTRIN) 800 MG tablet; Take 1 tablet (800 mg total) by mouth every 8 (eight) hours as needed. Work on losing weight to help reduce joint pain. May alternate with heat and ice application for pain relief. May also alternate with acetaminophen and Ibuprofen as prescribed pain relief. Other alternatives include massage, acupuncture and water aerobics.  You must stay active and avoid a sedentary lifestyle.   Routine adult health maintenance -     CBC -     CMP14+EGFR -     Lipid panel    Patient has been counseled on age-appropriate routine health concerns for screening and prevention. These are reviewed and up-to-date. Referrals have been placed accordingly. Immunizations are up-to-date or declined.    Subjective:   Chief Complaint  Patient presents with  . New Patient (Initial Visit)    Pt. is here to establish care. Pt. think her blood pressure is a concern and back pain.    HPI Lori Vaughan 36 y.o. female presents to office today to establish care. She has a history of postpartum depression and abnormal pap smear. She has complaints of back pain today. She states her 30 something year old brother in law moved in with her, her husband and their 4 children. He suffered a stroke and can not walk or talk. He has poorly controlled diabetes and she and her husband are his only primary caregivers. Also one of her children has ADHD and one has autism. She has many stressors. Feeling very frustrated. May need anxiolytic but would like to try coping skills at this time.    Back Pain She has complaints of low back pain. This has been going on for 2 years. She takes tylenol for pain which seems to help relieve the pain. There is no radiation of pain. Aggravating factors: prolonged sitting. Pain is  constant and includes stiffness and burning in the low back. Initial inciting event: none. Symptoms are worst: morning, mid-day, afternoon, evening, nighttime. Alleviating factors identifiable by patient are back strengthening exercises.  Previous workup: none. Previous treatments: tylenol which seems to help relieve the pain but only briefly.   Review of Systems  Constitutional: Negative for fever, malaise/fatigue and weight loss.  HENT: Negative.  Negative for nosebleeds.   Eyes: Negative.  Negative for blurred vision, double vision and photophobia.  Respiratory: Negative.  Negative for cough and shortness of breath.   Cardiovascular: Negative.  Negative for chest pain, palpitations and leg swelling.  Gastrointestinal: Negative.  Negative for heartburn, nausea and vomiting.  Musculoskeletal: Positive for back pain. Negative for myalgias.  Neurological: Negative.  Negative for dizziness, focal weakness, seizures and headaches.  Psychiatric/Behavioral: Negative for suicidal ideas.       STRESS    Past Medical History:  Diagnosis Date  . 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 Postpartum depression    postpartum depression after delivery of youngest son (age 7); depression outside pregnancy, treated in the past with antidepressant (unsure of med).  Not on meds now.    Past Surgical History:  Procedure Laterality Date  . CESAREAN SECTION  1998, 1999, 2005, 2012   x4  . TUBAL LIGATION     2012    Family History    Problem Relation Age of Onset  . Breast cancer Maternal Aunt        not sure of age at time of diagnosis  . Breast cancer Maternal Aunt        not sure of age at diagnosis  . Alcohol abuse Father   . Pancreatitis Father        alcoholic--cause of death  . Depression Brother        anxiety and depression, drug abuse  . Drug abuse Brother   . Autism Son     Social History Reviewed with no changes to be made  today.   Outpatient Medications Prior to Visit  Medication Sig Dispense Refill  . ferrous sulfate 325 (65 FE) MG tablet Take 1 tablet (325 mg total) by mouth 2 (two) times daily with a meal. (Patient not taking: Reported on 02/08/2018)  3  . Melatonin 1 MG CAPS Take 1 mg by mouth.     No facility-administered medications prior to visit.     No Known Allergies     Objective:    BP 109/72 (BP Location: Left Arm, Patient Position: Sitting, Cuff Size: Normal)   Pulse 77   Temp 98.9 F (37.2 C) (Oral)   Ht 5' 3" (1.6 m)   Wt 158 lb (71.7 kg)   LMP 01/29/2018   SpO2 97%   BMI 27.99 kg/m  Wt Readings from Last 3 Encounters:  02/08/18 158 lb (71.7 kg)  01/20/16 154 lb (69.9 kg)  10/05/14 163 lb 11.2 oz (74.3 kg)    Physical Exam  Constitutional: She is oriented to person, place, and time. She appears well-developed and well-nourished. She is cooperative.  HENT:  Head: Normocephalic and atraumatic.  Eyes: EOM are normal.  Neck: Normal range of motion.  Cardiovascular: Normal rate, regular rhythm and normal heart sounds. Exam reveals no gallop and no friction rub.  No murmur heard. Pulmonary/Chest: Effort normal and breath sounds normal. No tachypnea. No respiratory distress. She has no decreased breath sounds. She has no wheezes. She has no rhonchi. She has no rales. She exhibits no tenderness.  Abdominal: Bowel sounds are normal.  Musculoskeletal: Normal range of motion. She exhibits no edema.       Lumbar back: She exhibits pain (There is TTP).  Neurological: She is alert and oriented to person, place, and time. Coordination normal.  Skin: Skin is warm and dry.  Psychiatric: She has a normal mood and affect. Her behavior is normal. Judgment and thought content normal.  Nursing note and vitals reviewed.     Patient has been counseled extensively about nutrition and exercise as well as the importance of adherence with medications and regular follow-up. The patient was given  clear instructions to go to ER or return to medical center if symptoms don't improve, worsen or new problems develop. The patient verbalized understanding.   Follow-up: Return in about 4 weeks (around 03/08/2018) for f/u back pain and anxiety.   Gildardo Pounds, FNP-BC [facility] [place], [place]   02/08/2018, 2:50 PM

## 2018-02-08 NOTE — Patient Instructions (Signed)
Dolor de espalda en adultos Back Pain, Adult Muchos adultos sufren dolor de espalda de vez en cuando. Las causas comunes del dolor de espalda son las siguientes:  Ardelia Mems distensin del msculo o el ligamento.  Desgaste (degeneracin) de los discos vertebrales.  Artritis.  Un golpe en la espalda.  El dolor de espalda puede ser breve (agudo) o prolongado (crnico). Es posible que se realicen un examen fsico, anlisis de laboratorio u otros estudios de diagnstico por imgenes para Animator causa del Social research officer, government. Siga estas indicaciones en su casa: Control del dolor y de la rigidez  [employer] medicamentos de venta libre y los recetados solamente como se lo haya indicado el mdico.  Si se lo indican, aplique calor en la zona afectada con la frecuencia que le haya indicado el mdico. Use la fuente de calor que el mdico le recomiende, como una compresa de calor hmedo o una almohadilla trmica. ? Colquese una [employer] piel y la fuente de Freight forwarder. ? Aplique el calor durante 20 a 15minutos. ? Retire la fuente de calor si la piel se le pone de color rojo brillante. Esto es muy importante si no puede sentir el dolor, el calor ni el fro. Corre un mayor riesgo de sufrir quemaduras.  Si se lo indican, aplique hielo sobre la zona lesionada: ? Ponga el hielo en una bolsa plstica. ? Coloque una [employer] piel y la bolsa de hielo. ? Deje el hielo durante 35minutos, de 2 a 3veces por da, durante los primeros 2 o 3das. Actividad  No permanezca en la cama. Si hace reposo ms de 1 a 2 das, puede demorar su recuperacin.  Realice caminatas cortas en superficies planas tan pronto como le sea posible. Trate de caminar un poco ms de Publishing copy.  No se siente, conduzca o permanezca de pie en un mismo lugar durante ms de 30 minutos seguidos. Pararse o sentarse durante largos perodos de Radiographer, therapeutic la espalda.  Use tcnicas apropiadas para levantar objetos. Cuando se  inclina y Chief Executive Officer un [place], utilice posiciones que no sobrecarguen tanto la espalda: ? [place]. ? Mantenga la carga cerca del cuerpo. ? No se tuerza.  Haga actividad fsica habitualmente como se lo haya indicado el mdico. Hacer ejercicio ayudar a que su espalda se cure ms rpido. Adems, esto ayuda a prevenir lesiones de espalda al [employer] fuertes y flexibles.  El mdico puede recomendarle que consulte a un fisioterapeuta. Esta persona puede ayudarlo a elaborar un programa de ejercicios seguro. Haga ejercicios como se lo haya indicado el fisioterapeuta. Estilo de vida  Mantenga un peso saludable. El sobrepeso sobrecarga la espalda y hace que resulte difcil tener una buena [place].  Evite actividades o situaciones que lo hagan sentirse ansioso o estresado. Aprenda maneras de manejar la ansiedad y el estrs. Una forma de controlar el estrs es a travs del ejercicio. El estrs y la ansiedad aumentan la tensin muscular y pueden empeorar el dolor de espalda. Instrucciones generales  Duerma sobre un colchn firme en una posicin cmoda. Intente acostarse de costado, con las rodillas ligeramente flexionadas. Si se recuesta [employer], coloque una almohada debajo de las rodillas.  Siga el plan de tratamiento como se lo haya indicado el mdico. Esto puede incluir lo siguiente: ? Terapia cognitiva o conductual. ? Acupuntura o terapia de masajes. ? Yoga o meditacin. Comunquese con un mdico si:  Siente un dolor que no se alivia con reposo o medicamentos.  Siente mucho dolor que se extiende a las piernas o las nalgas.  El dolor no mejora en 2 semanas.  Siente dolor por la noche.  Pierde peso.  Tiene fiebre o siente escalofros. Solicite ayuda de inmediato si:  Tiene nuevos problemas para controlar la vejiga o los intestinos.  Siente debilidad o adormecimiento inusuales en los brazos o en las piernas.  Siente nuseas o vmitos.  Siente dolor  abdominal.  Siente que va a desmayarse. Resumen  Muchos adultos sufren dolor de espalda de vez en cuando. Es posible que se realicen un examen fsico, anlisis de laboratorio u otros estudios de diagnstico por imgenes para Veterinary surgeon causa del Engineer, mining.  Use tcnicas apropiadas para levantar objetos. Cuando se inclina y levanta un [place], utilice posiciones que no sobrecarguen tanto la espalda.  [name] los medicamentos de venta libre o recetados y aplique calor o hielo solamente como se lo haya indicado el mdico. Esta informacin no tiene Theme park manager el consejo del mdico. Asegrese de hacerle al mdico cualquier pregunta que tenga. Document Released: 04/06/2005 Document Revised: 07/27/2016 Document Reviewed: 07/27/2016 Elsevier Interactive Patient Education  2018 [employer].  Ejercicios para la espalda (Back Exercises) Los siguientes ejercicios fortalecen los msculos que dan soporte a la espalda y, [place], ayudan a Pharmacologist la flexibilidad de la zona lumbar. Hacer estos ejercicios puede ser de ayuda para Psychologist, sport and exercise de espalda o Engineer, materials actual. Si tiene dolor o molestias en la espalda, intente hacer estos ejercicios 2 o 3veces por da, o como se lo haya indicado el mdico. Cuando el dolor desaparezca, hgalos una vez por da, pero haga ms repeticiones de cada ejercicio. Si no tiene dolor o [employer], haga estos ejercicios una vez por da o como se lo haya indicado el mdico. EJERCICIOS Rodilla al pecho Repita estos pasos 3 o 5veces con cada pierna: 1. Acustese boca arriba sobre una cama dura o sobre el suelo con las piernas extendidas. 2. Lleve una rodilla al pecho. La otra pierna debe quedar extendida y en contacto con el suelo. 3. Mantenga la rodilla contra el pecho. Para lograrlo tmese la rodilla o el muslo. 4. Tire de la rodilla hasta sentir una elongacin suave en la parte baja de la espalda. 5. Mantenga la elongacin durante 10 a  30segundos. 6. Suelte y extienda la pierna lentamente. Inclinacin de la pelvis Repita estos pasos 5 o 10veces: 1. Acustese boca arriba sobre una cama dura o sobre el suelo con las piernas extendidas. 2. Flexione las rodillas de modo que apunten al techo y los pies queden apoyados en el suelo. 3. Contraiga los msculos de la parte baja del abdomen para empujar la zona lumbar contra el suelo. Con este movimiento se inclinar la pelvis de modo que el cccix apunte hacia el techo, en lugar de apuntar en direccin a los pies o al suelo. 4. Contraiga suavemente y respire con normalidad mientras mantiene esta posicin durante 5 a 10segundos. El perro y el gato Repita estos pasos hasta que la zona lumbar se vuelva ms flexible: 1. Apoye las palmas de las manos y las rodillas sobre una superficie firme. Las manos deben estar alineadas con los hombros y las rodillas con las caderas. Puede colocarse almohadillas debajo de las rodillas para estar cmodo. 2. Deje caer la cabeza y baje el cccix en direccin al suelo de modo que la zona lumbar se arquee como el lomo de un gato [place]. 3. Mantenga esta posicin durante 5segundos. 4.  Lentamente, levante la cabeza y eleve el cccix de modo que apunte en direccin al techo para que la espalda forme un arco hundido como el lomo de un perro contento. 5. Mantenga esta posicin durante 5segundos. Flexiones de brazos Repita estos pasos 5 o 10veces: 1. Acustese boca abajo en el suelo. 2. Coloque las palmas de las manos cerca de la cabeza, separadas aproximadamente al ancho de los hombros. 3. Con la espalda lo ms relajada posible y las caderas apoyadas en el suelo, extienda lentamente los brazos para levantar la mitad superior del cuerpo y Optometrist los hombros. No use los msculos de la espalda para elevar la parte superior del torso. Puede cambiar las manos de lugar para estar ms cmodo. 4. Mantenga esta posicin durante 5segundos mientras mantiene la  espalda relajada. 5. Lentamente vuelva a la posicin horizontal. Puentes Repita estos pasos 10veces: 1. Acustese boca arriba sobre una superficie firme. 2. Flexione las rodillas de modo que apunten al techo y los pies queden apoyados en el suelo. 3. Contraiga los glteos y despegue las nalgas del suelo hasta que la cintura est casi a la misma altura que las rodillas. Debe sentir el trabajo muscular en las nalgas y la parte posterior de los muslos. Si no siente el esfuerzo de [employer], aleje los pies 1 o 2pulgadas (2,5 o 5centmetros) de las nalgas. 4. Mantenga esta posicin durante 3 a 5segundos. 5. Baje lentamente las caderas a la posicin inicial y relaje los glteos por completo. Si este ejercicio le resulta muy fcil, intente realizarlo con los brazos cruzados [name]. Abdominales Repita estos pasos 5 o 10veces: 1. Acustese boca arriba sobre una cama dura o sobre el suelo con las piernas extendidas. 2. Flexione las rodillas de modo que apunten al techo y los pies queden apoyados en el suelo. 3. Cruce los [employer]. 4. Baje levemente el mentn en direccin al pecho sin doblar el cuello. 5. Contraiga los msculos del abdomen y con lentitud eleve el torso lo suficiente como para Artist los omplatos del suelo. No eleve el torso ms alto que eso, porque esto puede sobreexigir a la zona lumbar y no ayuda a Investment banker, operational. 6. Regrese lentamente a la posicin inicial. Elevaciones de espalda Repita estos pasos 5 o 10veces: 1. Acustese boca abajo con los brazos a los costados del cuerpo y apoye la frente en el suelo. 2. Contraiga los msculos de las piernas y los glteos. 3. Lentamente despegue el pecho del suelo [employer] las caderas bien apoyadas en el suelo. Mantenga la nuca alineada con la curvatura de la espalda. Los ojos deben mirar al suelo. 4. Mantenga esta posicin durante 3 a 5segundos. 5. Regrese lentamente a  la posicin inicial. SOLICITE ATENCIN MDICA SI:  El dolor o las molestias en la espalda se vuelven mucho ms intensos cuando hace un ejercicio.  El dolor o las molestias en la espalda no se Copy en el trmino de las 2horas posteriores a Copy. Si tiene alguno de [employer], deje de [employer] ejercicios de inmediato. No vuelva a hacer los ejercicios a menos que el mdico lo autorice. SOLICITE ATENCIN MDICA DE INMEDIATO SI:  Siente un dolor sbito e intenso en la espalda. Si esto ocurre, deje de [employer] ejercicios de inmediato. No vuelva a hacer los ejercicios a menos que el mdico lo autorice.  Esta informacin no tiene Theme park manager el consejo del mdico. Asegrese de hacerle al mdico cualquier  pregunta que tenga. Document Released: 04/06/2005 Document Revised: 12/26/2014 Document Reviewed: 05/31/2014 Elsevier Interactive Patient Education  2017 [employer].

## 2018-02-09 LAB — CMP14+EGFR
A/G RATIO: 1.3 (ref 1.2–2.2)
ALT: 17 IU/L (ref 0–32)
AST: 18 IU/L (ref 0–40)
Albumin: 4.2 g/dL (ref 3.5–5.5)
Alkaline Phosphatase: 121 IU/L — ABNORMAL HIGH (ref 39–117)
BUN / CREAT RATIO: 27 — AB (ref 9–23)
BUN: 15 mg/dL (ref 6–20)
CHLORIDE: 103 mmol/L (ref 96–106)
CO2: 23 mmol/L (ref 20–29)
Calcium: 8.8 mg/dL (ref 8.7–10.2)
Creatinine, Ser: 0.55 mg/dL — ABNORMAL LOW (ref 0.57–1.00)
GFR, EST AFRICAN AMERICAN: 140 mL/min/{1.73_m2} (ref >59)
GFR, EST NON AFRICAN AMERICAN: 121 mL/min/{1.73_m2} (ref >59)
GLOBULIN, TOTAL: 3.2 g/dL (ref 1.5–4.5)
Glucose: 120 mg/dL — ABNORMAL HIGH (ref 65–99)
POTASSIUM: 3.6 mmol/L (ref 3.5–5.2)
SODIUM: 139 mmol/L (ref 134–144)
TOTAL PROTEIN: 7.4 g/dL (ref 6.0–8.5)

## 2018-02-09 LAB — LIPID PANEL
CHOL/HDL RATIO: 3.9 ratio (ref 0.0–4.4)
Cholesterol, Total: 181 mg/dL (ref 100–199)
HDL: 46 mg/dL (ref >39)
LDL Calculated: 83 mg/dL (ref 0–99)
Triglycerides: 262 mg/dL — ABNORMAL HIGH (ref 0–149)
VLDL Cholesterol Cal: 52 mg/dL — ABNORMAL HIGH (ref 5–40)

## 2018-02-09 LAB — CBC
HEMATOCRIT: 32.3 % — AB (ref 34.0–46.6)
Hemoglobin: 9.8 g/dL — ABNORMAL LOW (ref 11.1–15.9)
MCH: 21.2 pg — ABNORMAL LOW (ref 26.6–33.0)
MCHC: 30.3 g/dL — ABNORMAL LOW (ref 31.5–35.7)
MCV: 70 fL — AB (ref 79–97)
PLATELETS: 372 10*3/uL (ref 150–450)
RBC: 4.63 x10E6/uL (ref 3.77–5.28)
RDW: 17.1 % — ABNORMAL HIGH (ref 12.3–15.4)
WBC: 5.7 10*3/uL (ref 3.4–10.8)

## 2018-02-13 ENCOUNTER — Other Ambulatory Visit: Payer: Self-pay | Admitting: Nurse Practitioner

## 2018-02-13 MED ORDER — FERROUS SULFATE 325 (65 FE) MG PO TABS: 325.0000 mg | ORAL_TABLET | Freq: Two times a day (BID) | ORAL | 6 refills | Status: DC

## 2018-02-14 MED FILL — FERROUS SULFATE 325 MG TAB: 325 (65 FE) | 30 days supply | Qty: 60 | Fill #0

## 2018-02-15 ENCOUNTER — Ambulatory Visit: Payer: Self-pay | Attending: Nurse Practitioner

## 2018-02-15 ENCOUNTER — Telehealth: Payer: Self-pay

## 2018-02-15 NOTE — Telephone Encounter (Signed)
CMA spoke to patient to inform on results and PCP advising.  Patient verified DOB. Patient understood.   [facility] Spanish interpreter Metiziel [phone] assist with the call.

## 2018-02-15 NOTE — Telephone Encounter (Signed)
-----   Message from Claiborne Rigg, NP sent at 02/13/2018  9:13 PM EDT ----- Labs show anemia. This is likely related to your menstrual cycles. I would recommend you take iron tablets 325mg  of ferrous sulfate. 1 tablet with a meal twice a day. Low iron levels can cause feelings of being tired all the time and can contribute to shortness of breath and increased heart rate. I will send a prescription to the pharmacy.

## 2018-03-11 ENCOUNTER — Ambulatory Visit: Payer: Self-pay | Admitting: Nurse Practitioner

## 2018-03-15 ENCOUNTER — Ambulatory Visit (HOSPITAL_COMMUNITY)
Admission: RE | Admit: 2018-03-15 | Discharge: 2018-03-15 | Disposition: A | Discharge status: Home / Self Care | Payer: Self-pay | Source: Ambulatory Visit | Attending: Nurse Practitioner | Admitting: Nurse Practitioner

## 2018-03-15 ENCOUNTER — Ambulatory Visit: Payer: Self-pay | Attending: Nurse Practitioner | Admitting: Nurse Practitioner

## 2018-03-15 ENCOUNTER — Encounter: Payer: Self-pay | Admitting: Nurse Practitioner

## 2018-03-15 VITALS — BP 100/67 | HR 70 | Temp 99.0°F | Ht 63.0 in | Wt 156.4 lb

## 2018-03-15 DIAGNOSIS — Z79899 Other long term (current) drug therapy: Secondary | ICD-10-CM | POA: Insufficient documentation

## 2018-03-15 DIAGNOSIS — G8929 Other chronic pain: Secondary | ICD-10-CM | POA: Insufficient documentation

## 2018-03-15 DIAGNOSIS — K1370 Unspecified lesions of oral mucosa: Secondary | ICD-10-CM | POA: Insufficient documentation

## 2018-03-15 DIAGNOSIS — M5441 Lumbago with sciatica, right side: Secondary | ICD-10-CM | POA: Insufficient documentation

## 2018-03-15 DIAGNOSIS — K068 Other specified disorders of gingiva and edentulous alveolar ridge: Secondary | ICD-10-CM

## 2018-03-15 MED ORDER — CHLORHEXIDINE GLUCONATE 0.12 % MT SOLN: 15.0000 mL | Freq: Two times a day (BID) | OROMUCOSAL | 1 refills | Status: AC

## 2018-03-15 MED FILL — CHLORHEXIDINE 0.12% RINSE: 0.12 | 15 days supply | Qty: 473 | Fill #0

## 2018-03-15 NOTE — Patient Instructions (Signed)
Back Exercises If you have pain in your back, do these exercises 2-3 times each day or as told by your doctor. When the pain goes away, do the exercises once each day, but repeat the steps more times for each exercise (do more repetitions). If you do not have pain in your back, do these exercises once each day or as told by your doctor. Exercises Single Knee to Chest  Do these steps 3-5 times in a row for each leg: 1. Lie on your back on a firm bed or the floor with your legs stretched out. 2. Bring one knee to your chest. 3. Hold your knee to your chest by grabbing your knee or thigh. 4. Pull on your knee until you feel a gentle stretch in your lower back. 5. Keep doing the stretch for 10-30 seconds. 6. Slowly let go of your leg and straighten it.  Pelvic Tilt  Do these steps 5-10 times in a row: 1. Lie on your back on a firm bed or the floor with your legs stretched out. 2. Bend your knees so they point up to the ceiling. Your feet should be flat on the floor. 3. Tighten your lower belly (abdomen) muscles to press your lower back against the floor. This will make your tailbone point up to the ceiling instead of pointing down to your feet or the floor. 4. Stay in this position for 5-10 seconds while you gently tighten your muscles and breathe evenly.  Cat-Cow  Do these steps until your lower back bends more easily: 1. Get on your hands and knees on a firm surface. Keep your hands under your shoulders, and keep your knees under your hips. You may put padding under your knees. 2. Let your head hang down, and make your tailbone point down to the floor so your lower back is round like the back of a cat. 3. Stay in this position for 5 seconds. 4. Slowly lift your head and make your tailbone point up to the ceiling so your back hangs low (sags) like the back of a cow. 5. Stay in this position for 5 seconds.  Press-Ups  Do these steps 5-10 times in a row: 1. Lie on your belly (face-down)  on the floor. 2. Place your hands near your head, about shoulder-width apart. 3. While you keep your back relaxed and keep your hips on the floor, slowly straighten your arms to raise the top half of your body and lift your shoulders. Do not use your back muscles. To make yourself more comfortable, you may change where you place your hands. 4. Stay in this position for 5 seconds. 5. Slowly return to lying flat on the floor.  Bridges  Do these steps 10 times in a row: 1. Lie on your back on a firm surface. 2. Bend your knees so they point up to the ceiling. Your feet should be flat on the floor. 3. Tighten your butt muscles and lift your butt off of the floor until your waist is almost as high as your knees. If you do not feel the muscles working in your butt and the back of your thighs, slide your feet 1-2 inches farther away from your butt. 4. Stay in this position for 3-5 seconds. 5. Slowly lower your butt to the floor, and let your butt muscles relax.  If this exercise is too easy, try doing it with your arms crossed over your chest. Belly Crunches  Do these steps 5-10 times in   a row: 1. Lie on your back on a firm bed or the floor with your legs stretched out. 2. Bend your knees so they point up to the ceiling. Your feet should be flat on the floor. 3. Cross your arms over your chest. 4. Tip your chin a little bit toward your chest but do not bend your neck. 5. Tighten your belly muscles and slowly raise your chest just enough to lift your shoulder blades a tiny bit off of the floor. 6. Slowly lower your chest and your head to the floor.  Back Lifts Do these steps 5-10 times in a row: 1. Lie on your belly (face-down) with your arms at your sides, and rest your forehead on the floor. 2. Tighten the muscles in your legs and your butt. 3. Slowly lift your chest off of the floor while you keep your hips on the floor. Keep the back of your head in line with the curve in your back. Look at  the floor while you do this. 4. Stay in this position for 3-5 seconds. 5. Slowly lower your chest and your face to the floor.  Contact a doctor if:  Your back pain gets a lot worse when you do an exercise.  Your back pain does not lessen 2 hours after you exercise. If you have any of these problems, stop doing the exercises. Do not do them again unless your doctor says it is okay. Get help right away if:  You have sudden, very bad back pain. If this happens, stop doing the exercises. Do not do them again unless your doctor says it is okay. This information is not intended to replace advice given to you by your health care provider. Make sure you discuss any questions you have with your health care provider. Document Released: 05/09/2010 Document Revised: 09/12/2015 Document Reviewed: 05/31/2014 Elsevier Interactive Patient Education  2018 [employer]  Chronic Back Pain When back pain lasts longer than 3 months, it is called chronic back pain.The cause of your back pain may not be known. Some common causes include:  Wear and tear (degenerative disease) of the bones, ligaments, or disks in your back.  Inflammation and stiffness in your back (arthritis).  People who have chronic back pain often go through certain periods in which the pain is more intense (flare-ups). Many people can learn to manage the pain with home care. Follow these instructions at home: Pay attention to any changes in your symptoms. Take these actions to help with your pain: Activity  Avoid bending and activities that make the problem worse.  Do not sit or stand in one place for long periods of time.  Take brief periods of rest throughout the day. This will reduce your pain. Resting in a lying or standing position is usually better than sitting to rest.  When you are resting for longer periods, mix in some mild activity or stretching between periods of rest. This will help to prevent stiffness and pain.  Get  regular exercise. Ask your health care provider what activities are safe for you.  Do not lift anything that is heavier than 10 lb (4.5 kg). Always use proper lifting technique, which includes: ? Bending your knees. ? Keeping the load close to your body. ? Avoiding twisting. Managing pain  If directed, apply ice to the painful area. Your health care provider may recommend applying ice during the first 24-48 hours after a flare-up begins. ? Put ice in a plastic bag. ? Place  a towel between your skin and the bag. ? Leave the ice on for 20 minutes, 2-3 times per day.  After icing, apply heat to the affected area as often as told by your health care provider. Use the heat source that your health care provider recommends, such as a moist heat pack or a heating pad. ? Place a towel between your skin and the heat source. ? Leave the heat on for 20-30 minutes. ? Remove the heat if your skin turns bright red. This is especially important if you are unable to feel pain, heat, or cold. You may have a greater risk of getting burned.  Try soaking in a warm tub.  Take over-the-counter and prescription medicines only as told by your health care provider.  Keep all follow-up visits as told by your health care provider. This is important. Contact a health care provider if:  You have pain that is not relieved with rest or medicine. Get help right away if:  You have weakness or numbness in one or both of your legs or feet.  You have trouble controlling your bladder or your bowels.  You have nausea or vomiting.  You have pain in your abdomen.  You have shortness of breath or you faint. This information is not intended to replace advice given to you by your health care provider. Make sure you discuss any questions you have with your health care provider. Document Released: 05/14/2004 Document Revised: 08/15/2015 Document Reviewed: 09/24/2014 Elsevier Interactive Patient Education  2018 [employer].

## 2018-03-15 NOTE — Progress Notes (Signed)
Assessment & Plan:  [name] was seen today for follow-up and dental pain.  Diagnoses and all orders for this visit:  Chronic bilateral low back pain with right-sided sciatica -     DG Lumbar Spine Complete; Future Work on losing weight to help reduce back pain. May alternate with heat and ice application for pain relief. May also alternate with acetaminophen and Ibuprofen as prescribed for back pain. Other alternatives include massage, acupuncture and water aerobics.  You must stay active and avoid a sedentary lifestyle.    Gum lesion -     chlorhexidine (PERIDEX) 0.12 % solution; Use as directed 15 mLs in the mouth or throat 2 (two) times daily for 14 days.    Patient has been counseled on age-appropriate routine health concerns for screening and prevention. These are reviewed and up-to-date. Referrals have been placed accordingly. Immunizations are up-to-date or declined.    Subjective:   Chief Complaint  Patient presents with  . Follow-up    Pt. is here to follow-up on anxiety and pain. Pt. stated her anxiety is better but she is trying to deal with pain without medicine and only take it when she needs it.   . Dental Pain    Pt. stated she is trying to make an appt. with the dentist and is having a little tooth pain.    HPI Lori Vaughan 37 y.o. female presents to office today for follow up to back pain. She also has concerns of lower left dental pain which has been ongoing for several weeks.  Back Pain Chronic low back with no radiation. Onset 2 years ago. Taking tylenol for pain. Sparing use of ibuprofen 800 mg as it causes her gastric irritation. Pain is described as burning, stiffness. Aggravating factors are twisting, bending or lifting. Symptoms are persistent. She denies any previous trauma or injury to her back. Previous work up none. Alleviating factors: home back strengthening exercises. She also has a history of depression.    Review of Systems    Constitutional: Negative for fever, malaise/fatigue and weight loss.  HENT: Negative.  Negative for nosebleeds.   Eyes: Negative.  Negative for blurred vision, double vision and photophobia.  Respiratory: Negative.  Negative for cough and shortness of breath.   Cardiovascular: Negative.  Negative for chest pain, palpitations and leg swelling.  Gastrointestinal: Negative.  Negative for heartburn, nausea and vomiting.  Musculoskeletal: Positive for back pain. Negative for myalgias.  Neurological: Negative.  Negative for dizziness, focal weakness, seizures and headaches.  Psychiatric/Behavioral: Negative.  Negative for suicidal ideas.     Past Medical History:  Diagnosis Date  . 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 Postpartum depression    postpartum depression after delivery of youngest son (age 13); depression outside pregnancy, treated in the past with antidepressant (unsure of med).  Not on meds now.    Past Surgical History:  Procedure Laterality Date  . CESAREAN SECTION  1998, 1999, 2005, 2012   x4  . TUBAL LIGATION     2012    Family History  Problem Relation Age of Onset  . Breast cancer Maternal Aunt        not sure of age at time of diagnosis  . Breast cancer Maternal Aunt        not sure of age at diagnosis  . Alcohol abuse Father   . Pancreatitis Father  alcoholic--cause of death  . Depression Brother        anxiety and depression, drug abuse  . Drug abuse Brother   . Autism Son     Social History Reviewed with no changes to be made today.   Outpatient Medications Prior to Visit  Medication Sig Dispense Refill  . ferrous sulfate 325 (65 FE) MG tablet Take 1 tablet (325 mg total) by mouth 2 (two) times daily with a meal. 60 tablet 6  . ibuprofen (ADVIL,MOTRIN) 800 MG tablet Take 1 tablet (800 mg total) by mouth every 8 (eight) hours as needed. 60 tablet 1  . Melatonin 1 MG CAPS Take 1 mg by  mouth.     No facility-administered medications prior to visit.     No Known Allergies     Objective:    BP 100/67 (BP Location: Left Arm, Patient Position: Sitting, Cuff Size: Normal)   Pulse 70   Temp 99 F (37.2 C) (Oral)   Ht 5\' 3"  (1.6 m)   Wt 156 lb 6.4 oz (70.9 kg)   LMP 03/03/2018   SpO2 97%   BMI 27.71 kg/m  Wt Readings from Last 3 Encounters:  03/15/18 156 lb 6.4 oz (70.9 kg)  02/08/18 158 lb (71.7 kg)  01/20/16 154 lb (69.9 kg)    Physical Exam  Constitutional: She is oriented to person, place, and time. She appears well-developed and well-nourished. She is cooperative.  HENT:  Head: Normocephalic and atraumatic.  Mouth/Throat: Oropharynx is clear and moist and mucous membranes are normal. Oral lesions present. No trismus in the jaw. Abnormal dentition. Dental caries present. No dental abscesses, uvula swelling or lacerations.    Eyes: EOM are normal.  Neck: Normal range of motion.  Cardiovascular: Normal rate, regular rhythm, normal heart sounds and intact distal pulses. Exam reveals no gallop and no friction rub.  No murmur heard. Pulmonary/Chest: Effort normal and breath sounds normal. No tachypnea. No respiratory distress. She has no decreased breath sounds. She has no wheezes. She has no rhonchi. She has no rales. She exhibits no tenderness.  Abdominal: Soft. Bowel sounds are normal.  Musculoskeletal: Normal range of motion. She exhibits no edema.       Lumbar back: She exhibits tenderness (with moderate pressure applied to lower back ). She exhibits normal range of motion and no bony tenderness.       Back:  Neurological: She is alert and oriented to person, place, and time. Coordination normal.  Skin: Skin is warm and dry.  Psychiatric: She has a normal mood and affect. Her behavior is normal. Judgment and thought content normal.  Nursing note and vitals reviewed.      Patient has been counseled extensively about nutrition and exercise as well as  the importance of adherence with medications and regular follow-up. The patient was given clear instructions to go to ER or return to medical center if symptoms don't improve, worsen or new problems develop. The patient verbalized understanding.   Follow-up: Return in about 24 days (around 04/08/2018) for back pain, gum lesion.   Claiborne RiggZelda W Reginold Beale, FNP-BC [facility] [place], [place] [phone]   03/20/2018, 9:31 PM

## 2018-03-20 ENCOUNTER — Encounter: Payer: Self-pay | Admitting: Nurse Practitioner

## 2018-04-08 ENCOUNTER — Other Ambulatory Visit: Payer: Self-pay

## 2018-04-08 ENCOUNTER — Encounter: Payer: Self-pay | Admitting: Nurse Practitioner

## 2018-04-08 ENCOUNTER — Ambulatory Visit: Payer: Self-pay | Attending: Nurse Practitioner | Admitting: Nurse Practitioner

## 2018-04-08 VITALS — BP 99/65 | HR 69 | Temp 98.5°F | Ht 63.0 in | Wt 161.8 lb

## 2018-04-08 DIAGNOSIS — M545 Low back pain, unspecified: Secondary | ICD-10-CM

## 2018-04-08 DIAGNOSIS — Z79899 Other long term (current) drug therapy: Secondary | ICD-10-CM | POA: Insufficient documentation

## 2018-04-08 DIAGNOSIS — K05319 Chronic periodontitis, localized, unspecified severity: Secondary | ICD-10-CM | POA: Insufficient documentation

## 2018-04-08 DIAGNOSIS — G8929 Other chronic pain: Secondary | ICD-10-CM | POA: Insufficient documentation

## 2018-04-08 DIAGNOSIS — K05219 Aggressive periodontitis, localized, unspecified severity: Secondary | ICD-10-CM

## 2018-04-08 MED ORDER — AMITRIPTYLINE HCL 25 MG PO TABS: 25.0000 mg | ORAL_TABLET | Freq: Every day | ORAL | 1 refills | Status: DC

## 2018-04-08 MED ORDER — CHLORHEXIDINE GLUCONATE 0.12 % MT SOLN: 15.0000 mL | Freq: Two times a day (BID) | OROMUCOSAL | 0 refills | Status: AC

## 2018-04-08 MED ORDER — CLINDAMYCIN HCL 300 MG PO CAPS: 300.0000 mg | ORAL_CAPSULE | Freq: Four times a day (QID) | ORAL | 0 refills | Status: AC

## 2018-04-08 MED ORDER — TIZANIDINE HCL 4 MG PO TABS: 4.0000 mg | ORAL_TABLET | Freq: Four times a day (QID) | ORAL | 0 refills | Status: DC | PRN

## 2018-04-08 MED FILL — CHLORHEXIDINE 0.12% RINSE: 0.12 | 15 days supply | Qty: 473 | Fill #0

## 2018-04-08 MED FILL — AMITRIPTYLINE HCL 25 MG TAB: 25 | 30 days supply | Qty: 30 | Fill #0

## 2018-04-08 MED FILL — CLINDAMYCIN HCL 300 MG CAP: 300 | 10 days supply | Qty: 40 | Fill #0

## 2018-04-08 MED FILL — tiZANidine HCL 4 MG TABS: 4 | 15 days supply | Qty: 60 | Fill #0

## 2018-04-08 NOTE — Progress Notes (Signed)
Assessment & Plan:  Lori BattiestVeronica was seen today for follow-up.  Diagnoses and all orders for this visit:  Gum abscess -     Ambulatory referral to Dentistry -     chlorhexidine (PERIDEX) 0.12 % solution; Use as directed 15 mLs in the mouth or throat 2 (two) times daily for 14 days. -     clindamycin (CLEOCIN) 300 MG capsule; Take 1 capsule (300 mg total) by mouth 4 (four) times daily for 10 days.  Chronic right-sided low back pain without sciatica -     Ambulatory referral to Physical Therapy -     amitriptyline (ELAVIL) 25 MG tablet; Take 1 tablet (25 mg total) by mouth at bedtime. -     tiZANidine (ZANAFLEX) 4 MG tablet; Take 1 tablet (4 mg total) by mouth every 6 (six) hours as needed for muscle spasms. Work on losing weight to help reduce back pain. May alternate with heat and ice application for pain relief. May also alternate with acetaminophen as prescribed for back pain. Other alternatives include massage, acupuncture and water aerobics.  You must stay active and avoid a sedentary lifestyle.    Patient has been counseled on age-appropriate routine health concerns for screening and prevention. These are reviewed and up-to-date. Referrals have been placed accordingly. Immunizations are up-to-date or declined.    Subjective:   Chief Complaint  Patient presents with  . Follow-up    back pain/gum lesion    HPI Lori Vaughan 37 y.o. female presents to office today for evaluation of dental abscess and chronic low back pain.   Abscess: Patient presents for evaluation of a dental abscess. Lesion is located in the lower left front mandible. Onset was several weeks ago. Symptoms have gradually worsened. Abscess has associated symptoms of spontaneous drainage. Patient does not have previous history of cutaneous abscesses. Patient does not have diabetes.   Chronic Low Back Pain Onset 2 years ago. Pain is described as aching and stiffness. Aggravating factors are twisting, bending  or lifting. She denies any trauma or injury to her back. She has not seen a specialist for her back nor has she has had any radiograph studies. She does practice home back strengthening exercises which provides minimal relief of her pain. She take tylenol for her back pain. Ibuprofen causes stomach irritation. She denies sciatica.    Review of Systems  Constitutional: Negative for fever, malaise/fatigue and weight loss.  HENT: Negative.  Negative for nosebleeds.        SEE HPI  Eyes: Negative.  Negative for blurred vision, double vision and photophobia.  Respiratory: Negative.  Negative for cough and shortness of breath.   Cardiovascular: Negative.  Negative for chest pain, palpitations and leg swelling.  Gastrointestinal: Negative.  Negative for heartburn, nausea and vomiting.  Musculoskeletal: Positive for back pain, joint pain and myalgias.       SEE HPI  Neurological: Negative.  Negative for dizziness, focal weakness, seizures and headaches.  Psychiatric/Behavioral: Negative.  Negative for suicidal ideas.    Past Medical History:  Diagnosis Date  . 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 Postpartum depression    postpartum depression after delivery of youngest son (age 677); depression outside pregnancy, treated in the past with antidepressant (unsure of med).  Not on meds now.    Past Surgical History:  Procedure Laterality Date  . CESAREAN SECTION  1998, 1999, 2005, 2012   x4  .  TUBAL LIGATION     2012    Family History  Problem Relation Age of Onset  . Breast cancer Maternal Aunt        not sure of age at time of diagnosis  . Breast cancer Maternal Aunt        not sure of age at diagnosis  . Alcohol abuse Father   . Pancreatitis Father        alcoholic--cause of death  . Depression Brother        anxiety and depression, drug abuse  . Drug abuse Brother   . Autism Son     Social History Reviewed with no  changes to be made today.   Outpatient Medications Prior to Visit  Medication Sig Dispense Refill  . ibuprofen (ADVIL,MOTRIN) 800 MG tablet Take 1 tablet (800 mg total) by mouth every 8 (eight) hours as needed. 60 tablet 1  . Melatonin 1 MG CAPS Take 1 mg by mouth.    . ferrous sulfate 325 (65 FE) MG tablet Take 1 tablet (325 mg total) by mouth 2 (two) times daily with a meal. (Patient not taking: Reported on 04/08/2018) 60 tablet 6   No facility-administered medications prior to visit.     No Known Allergies     Objective:    BP 99/65 (BP Location: Left Arm, Patient Position: Sitting, Cuff Size: Normal)   Pulse 69   Temp 98.5 F (36.9 C) (Oral)   Ht 5\' 3"  (1.6 m)   Wt 161 lb 12.8 oz (73.4 kg)   LMP 03/29/2018 (Exact Date)   SpO2 95%   Breastfeeding No   BMI 28.66 kg/m  Wt Readings from Last 3 Encounters:  04/08/18 161 lb 12.8 oz (73.4 kg)  03/15/18 156 lb 6.4 oz (70.9 kg)  02/08/18 158 lb (71.7 kg)    Physical Exam Vitals signs and nursing note reviewed.  Constitutional:      Appearance: She is well-developed.  HENT:     Head: Normocephalic and atraumatic.     Mouth/Throat:     Dentition: Abnormal dentition. Dental tenderness and dental abscesses present.     Tongue: No lesions.   Neck:     Musculoskeletal: Normal range of motion.  Cardiovascular:     Rate and Rhythm: Normal rate and regular rhythm.     Heart sounds: Normal heart sounds. No murmur. No friction rub. No gallop.   Pulmonary:     Effort: Pulmonary effort is normal. No tachypnea or respiratory distress.     Breath sounds: Normal breath sounds. No decreased breath sounds, wheezing, rhonchi or rales.  Chest:     Chest wall: No tenderness.  Abdominal:     General: Bowel sounds are normal.     Palpations: Abdomen is soft.  Musculoskeletal: Normal range of motion.     Lumbar back: She exhibits normal range of motion, no tenderness, no bony tenderness, no swelling and no edema.  Skin:    General: Skin  is warm and dry.  Neurological:     Mental Status: She is alert and oriented to person, place, and time.     Coordination: Coordination normal.  Psychiatric:        Behavior: Behavior normal. Behavior is cooperative.        Thought Content: Thought content normal.        Judgment: Judgment normal.          Patient has been counseled extensively about nutrition and exercise as well as the importance of  adherence with medications and regular follow-up. The patient was given clear instructions to go to ER or return to medical center if symptoms don't improve, worsen or new problems develop. The patient verbalized understanding.   Follow-up: Return in about 4 weeks (around 05/06/2018) for back pain/ gum abscess.   Claiborne Rigg, FNP-BC [facility] [place], [place] [phone]   04/10/2018, 5:23 PM

## 2018-04-10 ENCOUNTER — Encounter: Payer: Self-pay | Admitting: Nurse Practitioner

## 2018-04-25 ENCOUNTER — Encounter: Payer: Self-pay | Admitting: Physical Therapy

## 2018-04-25 ENCOUNTER — Ambulatory Visit: Payer: Self-pay | Attending: Nurse Practitioner | Admitting: Physical Therapy

## 2018-04-25 ENCOUNTER — Other Ambulatory Visit: Payer: Self-pay

## 2018-04-25 DIAGNOSIS — G8929 Other chronic pain: Secondary | ICD-10-CM | POA: Insufficient documentation

## 2018-04-25 DIAGNOSIS — R262 Difficulty in walking, not elsewhere classified: Secondary | ICD-10-CM | POA: Insufficient documentation

## 2018-04-25 DIAGNOSIS — M545 Low back pain, unspecified: Secondary | ICD-10-CM

## 2018-04-25 DIAGNOSIS — R208 Other disturbances of skin sensation: Secondary | ICD-10-CM | POA: Insufficient documentation

## 2018-04-25 NOTE — Therapy (Signed)
[facility] [address] [place], [place], [address] Phone: [phone]   Fax:  [phone]  Physical Therapy Evaluation  Patient Details  Name: Lori Vaughan MRN: [id number] Date of Birth: 13-Aug-1980 Referring Provider (PT): Dr. Bertram Denver , NP    Encounter Date: 04/25/2018  PT End of Session - 04/25/18 1527    Visit Number  1    Number of Visits  12    Date for PT Re-Evaluation  06/06/18    PT Start Time  1422    PT Stop Time  1510    PT Time Calculation (min)  48 min    Activity Tolerance  Patient tolerated treatment well    Behavior During Therapy  [facility] for tasks assessed/performed       Past Medical History:  Diagnosis Date  . 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 Postpartum depression    postpartum depression after delivery of youngest son (age 63); depression outside pregnancy, treated in the past with antidepressant (unsure of med).  Not on meds now.    Past Surgical History:  Procedure Laterality Date  . CESAREAN SECTION  1998, 1999, 2005, 2012   x4  . TUBAL LIGATION     2012    There were no vitals filed for this visit.   Subjective Assessment - 04/25/18 1425    Subjective  Patient has been seeing MD for her back since this past fall 2019.  She recalls a previous injury about 10 yrs ago.  Pain has been intermittent over the years but is now more constant for the past few months.  Has not been sleeping in the bed for the 3 mos.  She reports pain in Rt side of her low back and extending into her Rt hip and thigh..  Occasional tingling and burning.  She has difficulty standing to wash dishes, do normal ADLs, walking longer distances.  Needs to take increased time to stand up if she has been sitting >5 min.  Sometimes has Rt leg weakness during the day.      Pertinent History  none relevant     Limitations  Sitting;Standing;Lifting;House hold  activities;Walking    Diagnostic tests  XR was normal.     Patient Stated Goals  Patient would like to have this pain go away.      Currently in Pain?  Yes    Pain Score  5     Pain Location  Back    Pain Orientation  Right;Lower    Pain Descriptors / Indicators  Constant;Burning    Pain Type  Chronic pain    Pain Onset  More than a month ago    Pain Frequency  Constant    Aggravating Factors   sitting, standing long time     Pain Relieving Factors  meds, positioning , extension     Effect of Pain on Daily Activities  limits sleep, activity         [facility] PT Assessment - 04/25/18 0001      Assessment   Medical Diagnosis  low back pain     Referring Provider (PT)  Bertram Denver , NP     Onset Date/Surgical Date  --   chronic    Prior Therapy  No       Precautions   Precautions  None      Restrictions   Weight Bearing Restrictions  No  Balance Screen   Has the patient fallen in the past 6 months  No      Home Environment   Living Environment  Private residence    Living Arrangements  Spouse/significant other;Children;Other relatives    Home Layout  One level    Additional Comments  hard to do stairs in the community       Prior Function   Level of Independence  Independent    Vocation  Unemployed    Leisure  nothing, right now has share the responsibility of caring for her brother in law who is recovering from CVA      Cognition   Overall Cognitive Status  Within Functional Limits for tasks assessed      Observation/Other Assessments   Focus on Therapeutic Outcomes (FOTO)   NT due to no language set up       AROM   Lumbar Flexion  pain Rt low back 25%     Lumbar Extension  pain, limited 25%    Lumbar - Right Side Bend  pain central    Lumbar - Left Side Bend  pain Rt    Lumbar - Right Rotation  WNL    Lumbar - Left Rotation  WNL      Strength   Right Hip Flexion  4/5    Right Hip Extension  3+/5   pain   Right Hip ABduction  4+/5    Left Hip Flexion   4/5    Left Hip Extension  3+/5   pain    Left Hip ABduction  4+/5    Right Knee Flexion  5/5    Right Knee Extension  5/5    Left Knee Flexion  5/5    Left Knee Extension  5/5      Flexibility   Hamstrings  tight on Rt side     Piriformis  spasm on Rt side       Palpation   Spinal mobility  normal, sore from L3- to lateral sacral border     SI assessment   initially Rt pelvis anterior but levelled with bridge     Palpation comment  sore but painful throughout L spine and into gluteals, piriformis       Special Tests    Special Tests  Lumbar    Other special tests  extension felt good to her     Lumbar Tests  Prone Knee Bend Test;Straight Leg Raise      Prone Knee Bend Test   Findings  Negative      Straight Leg Raise   Findings  Negative          Objective measurements completed on examination: See above findings.     PT Education - 04/25/18 1445    Education Details  HEP, PT/POC    Person(s) Educated  Patient    Methods  Explanation    Comprehension  Verbalized understanding          PT Long Term Goals - 04/25/18 1528      PT LONG TERM GOAL #1   Title  Patient will be able to stand to do light housework, dishes for 15 min with no pain in Rt LE     Time  6    Period  Weeks    Status  New    Target Date  06/06/18      PT LONG TERM GOAL #2   Title  Pt will be I with HEP for core, hip  strength and flexibility upon discharge    Time  6    Period  Weeks    Status  New    Target Date  06/06/18      PT LONG TERM GOAL #3   Title  Pt will be able to improve her ability to transition to standing, roll, supine to sit without increasing back pain     Time  6    Period  Weeks    Status  New    Target Date  06/06/18      PT LONG TERM GOAL #4   Title  Pt will be able to squat, lift from the floor with good form to prevent reinjury.     Time  6    Period  Weeks    Status  New    Target Date  06/06/18      PT LONG TERM GOAL #5   Title  Pt will be able  to walk through the grocery store for 30 min with min increase in hip, back pain     Time  6    Period  Weeks    Status  New    Target Date  06/06/18             Plan - 04/25/18 1534    Clinical Impression Statement  Patient with mod complexity eval for low back pain with Rt sided referral into Rt hip.  Her symptoms are consistent with piriformis syndrome vs lumbar radiculopathy as she does have spasm and sensory distrubance in Rt hip.  She is currently under alot of stress as she and her family have taken in her brother in law who has had a stroke.  She should do well as her pain has been fairly short lived however, her home situation is not expected to change in the near future.      History and Personal Factors relevant to plan of care:  remote low back pain injury , child birth x 4, sleeping on a couch due to current home situation    Clinical Presentation  Evolving    Clinical Presentation due to:  pain is constant and has been worsening for the past 2-3 mos     Clinical Decision Making  Moderate    Rehab Potential  Excellent    PT Frequency  2x / week    PT Duration  6 weeks    PT Treatment/Interventions  Taping;ADLs/Self Care Home Management;Cryotherapy;Electrical Stimulation;Traction;Ultrasound;Therapeutic exercise;Patient/family education;Manual techniques;Dry needling;Balance training;Functional mobility training;Iontophoresis 4mg /ml Dexamethasone;Moist Heat;Neuromuscular re-education    PT Next Visit Plan  check HEP, (piriformis) , manual/STW, US .  Core     PT Home Exercise Plan  piriformis, extension POE/childs pose     Consulted and Agree with Plan of Care  Patient       Patient will benefit from skilled therapeutic intervention in order to improve the following deficits and impairments:  Decreased activity tolerance, Decreased strength, Increased fascial restricitons, Impaired UE functional use, Postural dysfunction, Pain, Decreased range of motion, Decreased balance,  Decreased endurance, Decreased mobility, Difficulty walking, Improper body mechanics  Visit Diagnosis: Chronic right-sided low back pain without sciatica  Difficulty in walking, not elsewhere classified  Other disturbances of skin sensation     Problem List Patient Active Problem List   Diagnosis Date Noted  . Anemia 01/23/2016  . LGSIL on Pap smear of cervix 08/19/2014  . Vaginitis and vulvovaginitis 06/25/2014  . 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 Supervision of normal pregnancy  07/02/2010    PAA,JENNIFER 04/25/2018, 3:50 PM  [facility] [address] [place], [place], [address] Phone: [phone]   Fax:  [phone]  Name: Dalery Heitkamp MRN: [id number] Date of Birth: November 25, 1980  Karie Mainland, PT 04/25/18 3:50 PM Phone: [phone] Fax: [phone]

## 2018-05-09 ENCOUNTER — Ambulatory Visit: Payer: Self-pay | Admitting: Nurse Practitioner

## 2018-05-10 ENCOUNTER — Encounter: Payer: Self-pay | Admitting: Physical Therapy

## 2018-05-10 ENCOUNTER — Ambulatory Visit: Payer: Self-pay | Admitting: Physical Therapy

## 2018-05-10 DIAGNOSIS — G8929 Other chronic pain: Secondary | ICD-10-CM

## 2018-05-10 DIAGNOSIS — R208 Other disturbances of skin sensation: Secondary | ICD-10-CM

## 2018-05-10 DIAGNOSIS — M545 Low back pain, unspecified: Secondary | ICD-10-CM

## 2018-05-10 DIAGNOSIS — R262 Difficulty in walking, not elsewhere classified: Secondary | ICD-10-CM

## 2018-05-10 NOTE — Therapy (Signed)
[facility] Outpatient Rehabilitation [facility] [address] [place], [place], [address] Phone: [phone]   Fax:  [phone]  Physical Therapy Treatment  Patient Details  Name: Lori Vaughan MRN: [id number] Date of Birth: 1981/03/11 Referring Provider (PT): Bertram Denver , NP    Encounter Date: 05/10/2018  PT End of Session - 05/10/18 1725    Visit Number  2    Number of Visits  12    Date for PT Re-Evaluation  06/06/18    PT Start Time  1500    PT Stop Time  1555    PT Time Calculation (min)  55 min    Activity Tolerance  Patient tolerated treatment well    Behavior During Therapy  [facility] for tasks assessed/performed       Past Medical History:  Diagnosis Date  . 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 Postpartum depression    postpartum depression after delivery of youngest son (age 109); depression outside pregnancy, treated in the past with antidepressant (unsure of med).  Not on meds now.    Past Surgical History:  Procedure Laterality Date  . CESAREAN SECTION  1998, 1999, 2005, 2012   x4  . TUBAL LIGATION     2012    There were no vitals filed for this visit.  Subjective Assessment - 05/10/18 1509    Subjective  has new to her pain in right neck.    has decreased pain in right hip  more centralized to low back.     Patient is accompained by:  Interpreter   video   Currently in Pain?  Yes    Pain Score  3     Pain Location  Back    Pain Descriptors / Indicators  Burning   hot   Aggravating Factors   walking more than 10-15 minutes,bending over to clean,  tieing shoes.  sitting for a long time,  getting in / out of bed    Pain Relieving Factors  exercise    Effect of Pain on Daily Activities  extra time with cleaning,  needs arms to stand up    Multiple Pain Sites  Yes    Pain Score  3    Pain Location  Neck    Pain Orientation  Right    Pain Descriptors / Indicators   Burning   twist type burning   Pain Type  Acute pain    Pain Radiating Towards  right side    Pain Onset  In the past 7 days    Pain Frequency  Constant    Aggravating Factors   turning head,  arm ER shoulder at 90, face down    Pain Relieving Factors  hot shower,  massage                       [facility] Adult PT Treatment/Exercise - 05/10/18 0001      Lumbar Exercises: Stretches   Hip Flexor Stretch  Right;3 reps;30 seconds    Hip Flexor Stretch Limitations  decreases pain    Quad Stretch  Right;3 reps;30 seconds    Quad Stretch Limitations  feels good    Piriformis Stretch  Right;3 reps;30 seconds    Piriformis Stretch Limitations  cued for lighter stretch    Other Lumbar Stretch Exercise  quadratus lumborum stretch on side arm overhead    Piriformis Stretch  3 reps;30 seconds  Piriformis Stretch Limitations  cued for gentle stretch      Knee/Hip Exercises: Supine   Straight Leg Raises  10 reps;AAROM;Right    Straight Leg Raises Limitations  assisted in leveling pelvis    Other Supine Knee/Hip Exercises  hip flexion 10 X right only,   bent knee   Other Supine Knee/Hip Exercises  muscle energy  right to lower pelvis  able to decrease pain      Cryotherapy   Number Minutes Cryotherapy  10 Minutes    Cryotherapy Location  Hip    Type of Cryotherapy  --   cold pack     Manual Therapy   Manual Therapy  Soft tissue mobilization    Manual therapy comments  tissue softened pain reduced.     Soft tissue mobilization  hip low back and quadratus lumborum. trigger point release,  tissue softened  passive stretch to QL at hips and ribs with strumming to lengthen.              PT Education - 05/10/18 1552    Education Details  HEP,  cold pack brochure    Person(s) Educated  Patient    Methods  Explanation;Demonstration;Tactile cues;Verbal cues;Handout    Comprehension  Returned demonstration;Verbalized understanding          PT Long Term Goals - 04/25/18  1528      PT LONG TERM GOAL #1   Title  Patient will be able to stand to do light housework, dishes for 15 min with no pain in Rt LE     Time  6    Period  Weeks    Status  New    Target Date  06/06/18      PT LONG TERM GOAL #2   Title  Pt will be I with HEP for core, hip strength and flexibility upon discharge    Time  6    Period  Weeks    Status  New    Target Date  06/06/18      PT LONG TERM GOAL #3   Title  Pt will be able to improve her ability to transition to standing, roll, supine to sit without increasing back pain     Time  6    Period  Weeks    Status  New    Target Date  06/06/18      PT LONG TERM GOAL #4   Title  Pt will be able to squat, lift from the floor with good form to prevent reinjury.     Time  6    Period  Weeks    Status  New    Target Date  06/06/18      PT LONG TERM GOAL #5   Title  Pt will be able to walk through the grocery store for 30 min with min increase in hip, back pain     Time  6    Period  Weeks    Status  New    Target Date  06/06/18            Plan - 05/10/18 1726    Clinical Impression Statement  Patient was able to report decreased pain at end of session after exercise and manual PT.  She has been doing the exercises that help and was able to progress HEp today.  Pain is centralizing in hip and new pain in neck may be helped with improved posture.     PT Next Visit  Plan  check HEP, (piriformis) , manual/STW, US .  Core .  Check new exercise.  check to see if pelvis is level may need muscle energy prior to stabilization.     PT Home Exercise Plan  piriformis, extension POE/childs pose ,  hip flexor/ quad stretch,  hamstring stretch    Consulted and Agree with Plan of Care  Patient       Patient will benefit from skilled therapeutic intervention in order to improve the following deficits and impairments:     Visit Diagnosis: Difficulty in walking, not elsewhere classified  Chronic right-sided low back pain without  sciatica  Other disturbances of skin sensation     Problem List Patient Active Problem List   Diagnosis Date Noted  . Anemia 01/23/2016  . LGSIL on Pap smear of cervix 08/19/2014  . Vaginitis and vulvovaginitis 06/25/2014  . 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 Supervision of normal pregnancy 07/02/2010    Macyn Remmert  PTA 05/10/2018, 5:32 PM  [facility] [address] [place], [place], [address] Phone: [phone]   Fax:  [phone]  Name: Lori Vaughan MRN: [id number] Date of Birth: [birth year]

## 2018-05-10 NOTE — Patient Instructions (Addendum)
Hamstring Stretch: Active    Sujete la rodilla derecha por detrs. Comience con la rodilla flexionada, intente extender la rodilla hasta sentir un estiramiento cmodo en la parte posterior del muslo. Sostenga ___30_ segundos. Repita ___3_ Anthoney Harada por rutina. Realice _1___ Carlis Stable por sesin. Realice __1 X dia   Hip Flexor Stretch    Acostado sobre la espalda cerca del borde de la cama, doble una pierna apoyando el pie sobre la cama. Deje la otra pierna colgando del borde, relajada, con el muslo totalmente apoyado sobre la cama. Repita _3___ veces. Haga __1__ sesiones por da.  Ejercicio avanzado: Doble la rodilla hacia atrs manteniendo el muslo en contacto con la cama.  http://gt2.exer.us/347   Copyright  VHI. All rights reserved.    http://orth.exer.us/159   Copyright  VHI. All rights reserved.

## 2018-05-12 ENCOUNTER — Encounter: Payer: Self-pay | Admitting: Physical Therapy

## 2018-05-12 ENCOUNTER — Ambulatory Visit: Payer: Self-pay | Admitting: Physical Therapy

## 2018-05-12 DIAGNOSIS — R262 Difficulty in walking, not elsewhere classified: Secondary | ICD-10-CM

## 2018-05-12 DIAGNOSIS — R208 Other disturbances of skin sensation: Secondary | ICD-10-CM

## 2018-05-12 DIAGNOSIS — G8929 Other chronic pain: Secondary | ICD-10-CM

## 2018-05-12 DIAGNOSIS — M545 Low back pain: Secondary | ICD-10-CM

## 2018-05-12 NOTE — Therapy (Signed)
[facility] Outpatient Rehabilitation [facility] [address] [place], [place], [address] Phone: [phone]   Fax:  [phone]  Physical Therapy Treatment  Patient Details  Name: Olena Willy MRN: [id number] Date of Birth: 07-16-1980 Referring Provider (PT): Bertram Denver , NP    Encounter Date: 05/12/2018  PT End of Session - 05/12/18 1544    Visit Number  3    Number of Visits  12    Date for PT Re-Evaluation  06/06/18    PT Start Time  1505    PT Stop Time  1545    PT Time Calculation (min)  40 min    Activity Tolerance  Patient tolerated treatment well    Behavior During Therapy  [facility] for tasks assessed/performed       Past Medical History:  Diagnosis Date  . 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 Postpartum depression    postpartum depression after delivery of youngest son (age 84); depression outside pregnancy, treated in the past with antidepressant (unsure of med).  Not on meds now.    Past Surgical History:  Procedure Laterality Date  . CESAREAN SECTION  1998, 1999, 2005, 2012   x4  . TUBAL LIGATION     2012    There were no vitals filed for this visit.  Subjective Assessment - 05/12/18 1511    Subjective  "When I sit too long, I feel like I have to stretch my back because it gets tired and tight. I can feel a bigger difference in my hips now in comparison to last time I was here."    Pain Score  3     Pain Location  Hip    Pain Orientation  Right    Aggravating Factors   same as last visit    Pain Relieving Factors  same as last visit                       [facility] Adult PT Treatment/Exercise - 05/12/18 0001      Lumbar Exercises: Stretches   Passive Hamstring Stretch  30 seconds;2 reps   With gentle massage of hamstrings   Single Knee to Chest Stretch  4 reps;30 seconds    Hip Flexor Stretch  Right;3 reps;30 seconds    Hip Flexor Stretch Limitations   decreases pain    Quad Stretch  Right;3 reps;30 seconds    Quad Stretch Limitations  supine with gentle massage    Other Lumbar Stretch Exercise  AAROM leg lengthener on side      Lumbar Exercises: Supine   Ab Set  10 reps      Lumbar Exercises: Quadruped   [place]  10 reps    Other Quadruped Lumbar Exercises  childs pose x 30 sec    With laterals     Knee/Hip Exercises: Supine   Bridges  Strengthening;Both;10 reps    Bridges Limitations  Feels good    Other Supine Knee/Hip Exercises  muscle energy  right to lower pelvis  able to decrease pain      Modalities   Modalities  Cryotherapy      Cryotherapy   Number Minutes Cryotherapy  10 Minutes    Cryotherapy Location  Hip    Type of Cryotherapy  Ice pack      Manual Therapy   Manual Therapy  Soft tissue mobilization    Manual therapy comments  tissue  softened pain reduced.     Soft tissue mobilization  passive stretch to Quadratus lumborum.             PT Education - 05/12/18 1757    Education Details  Exercise form    Person(s) Educated  Patient    Methods  Explanation;Demonstration;Tactile cues;Verbal cues    Comprehension  Returned demonstration          PT Long Term Goals - 04/25/18 1528      PT LONG TERM GOAL #1   Title  Patient will be able to stand to do light housework, dishes for 15 min with no pain in Rt LE     Time  6    Period  Weeks    Status  New    Target Date  06/06/18      PT LONG TERM GOAL #2   Title  Pt will be I with HEP for core, hip strength and flexibility upon discharge    Time  6    Period  Weeks    Status  New    Target Date  06/06/18      PT LONG TERM GOAL #3   Title  Pt will be able to improve her ability to transition to standing, roll, supine to sit without increasing back pain     Time  6    Period  Weeks    Status  New    Target Date  06/06/18      PT LONG TERM GOAL #4   Title  Pt will be able to squat, lift from the floor with good form to prevent  reinjury.     Time  6    Period  Weeks    Status  New    Target Date  06/06/18      PT LONG TERM GOAL #5   Title  Pt will be able to walk through the grocery store for 30 min with min increase in hip, back pain     Time  6    Period  Weeks    Status  New    Target Date  06/06/18            Plan - 05/12/18 1759    Clinical Impression Statement  Pain continues to improve as pelvis control improves.  Stretching  helpful,  stabilization challanging.    PT Next Visit Plan  check HEP, (piriformis) , manual/STW, US .  Core .  Check new exercise.  check to see if pelvis is level may need muscle energy prior to stabilization.   Ready for stabilization for HEP next visit?    PT Home Exercise Plan  piriformis, extension POE/childs pose ,  hip flexor/ quad stretch,  hamstring stretch    Consulted and Agree with Plan of Care  Patient       Patient will benefit from skilled therapeutic intervention in order to improve the following deficits and impairments:     Visit Diagnosis: Difficulty in walking, not elsewhere classified  Chronic right-sided low back pain without sciatica  Other disturbances of skin sensation     Problem List Patient Active Problem List   Diagnosis Date Noted  . Anemia 01/23/2016  . LGSIL on Pap smear of cervix 08/19/2014  . Vaginitis and vulvovaginitis 06/25/2014  . 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 Supervision of normal pregnancy 07/02/2010    Mer[facility] Franklin CenterARRIS,Dontea Corlew  PTA Documentation assist By Wayna ChaletSPTA Heather Marshall 05/12/2018, 6:03 PM  [facility] [address] [place], [place], [address] Phone: [phone]   Fax:  [phone]  Name: Estevan RyderVeronica Romero-Gomez MRN: [id number] Date of Birth: 1980/10/04

## 2018-05-16 ENCOUNTER — Encounter: Payer: Self-pay | Admitting: Physical Therapy

## 2018-05-16 ENCOUNTER — Ambulatory Visit: Payer: Self-pay | Admitting: Physical Therapy

## 2018-05-16 DIAGNOSIS — G8929 Other chronic pain: Secondary | ICD-10-CM

## 2018-05-16 DIAGNOSIS — R262 Difficulty in walking, not elsewhere classified: Secondary | ICD-10-CM

## 2018-05-16 DIAGNOSIS — R208 Other disturbances of skin sensation: Secondary | ICD-10-CM

## 2018-05-16 DIAGNOSIS — M545 Low back pain, unspecified: Secondary | ICD-10-CM

## 2018-05-16 NOTE — Patient Instructions (Signed)
Issued from exercise drawer: Ball exercises  For stabilization  And pelvic mobility All exercises issued Daily 10 reps  1 to 5 second hold

## 2018-05-16 NOTE — Therapy (Signed)
[place], [place], [address] Phone: [phone]   Fax:  [phone]  Physical Therapy Treatment  Patient Details  Name: Lori Vaughan MRN: [id number] Date of Birth: 1980-09-21 Referring Provider (PT): Geryl [place] , NP    Encounter Date: 05/16/2018  PT End of Session - 05/16/18 1615    Visit Number  4    Number of Visits  12    Date for PT Re-Evaluation  06/06/18    PT Start Time  0177    PT Stop Time  1627    PT Time Calculation (min)  41 min    Activity Tolerance  Patient tolerated treatment well    Behavior During Therapy  [facility] for tasks assessed/performed       Past Medical History:  Diagnosis Date  . 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 Postpartum depression    postpartum depression after delivery of youngest son (age 48); depression outside pregnancy, treated in the past with antidepressant (unsure of med).  Not on meds now.    Past Surgical History:  Procedure Laterality Date  . [place], 2005, 2012   x4  . TUBAL LIGATION     2012    There were no vitals filed for this visit.  Subjective Assessment - 05/16/18 1550    Subjective  "My pain is less today than it has been. I can maybe stand for 5 minutes before I get uncomfortable."    Aggravating Factors   prolonged standing    Pain Relieving Factors  rest    Effect of Pain on Daily Activities  prolonged time washing dishes and standing cleaning activities                       [facility] Adult PT Treatment/Exercise - 05/16/18 0001      Lumbar Exercises: Stretches   Active Hamstring Stretch  2 reps;30 seconds    Pelvic Tilt  20 reps   APT/PPT, laterals   Pelvic Tilt Limitations  On physioball    Other Lumbar Stretch Exercise  Pelvic mobility CW/CCW on physioball x10 each way    Other Lumbar Stretch Exercise  QL stretch on  physioball      Lumbar Exercises: Seated   Long Arc Quad on [place]  Both;10 reps    Other Seated Lumbar Exercises  Bouncing with alt arm reach on physioball; 2 min; Marching with arms abducted; marching and alt arms 1 min      Lumbar Exercises: Supine   Ab Set  10 reps      Lumbar Exercises: Prone   Other Prone Lumbar Exercises  Glut sets with multifidus contraction x10     Other Prone Lumbar Exercises  Heel adductor squeeze with knees bent x10; ER/IR stretch with bent knees      Knee/Hip Exercises: Supine   Bridges  Strengthening;Both;10 reps    Bridges with [employer]  5 reps   Attempted but had pain in adductors   Other Supine Knee/Hip Exercises  ball squeezes in hooklying x10    Other Supine Knee/Hip Exercises  muscle energy  right to lower pelvis  able to decrease pain   x10                 PT Long Term Goals - 05/16/18 1552      PT LONG TERM GOAL #1  Title  Patient will be able to stand to do light housework, dishes for 15 min with no pain in Rt LE     Baseline  Is able to stand for 15 minutes before pain in rt LE starts.     Time  6    Period  Weeks    Status  Achieved      PT LONG TERM GOAL #2   Title  Pt will be I with HEP for core, hip strength and flexibility upon discharge    Time  6    Period  Weeks    Status  On-going      PT LONG TERM GOAL #3   Title  Pt will be able to improve her ability to transition to standing, roll, supine to sit without increasing back pain     Baseline  Still causing pain    Time  6    Period  Weeks    Status  On-going      PT LONG TERM GOAL #4   Title  Pt will be able to squat, lift from the floor with good form to prevent reinjury.     Time  6    Period  Weeks    Status  On-going      PT LONG TERM GOAL #5   Title  Pt will be able to walk through the grocery store for 30 min with min increase in hip, back pain     Baseline  Can walk for 25 minutes with min hip/back pain    Time  6    Period  Weeks    Status   Partially Met            Plan - 05/16/18 1617    Clinical Impression Statement  Pt has no pain today, but has a pain of 5/10 during ADLs and rests/stretches to reduce pain. Pt reports liking to lay on her stomach and states that it helps reduce her pain. Pt reported pain during bridge with ball squeeze, but no pain with adductor heel squeezes in prone. Pt received heat at end of tx.     PT Next Visit Plan  check HEP, (piriformis) , manual/STW, [place] .  Core .  Check new exercise.  check to see if pelvis is level may need muscle energy prior to stabilization.   Add prone exercises    PT Home Exercise Plan  piriformis, extension POE/childs pose ,  hip flexor/ quad stretch,  hamstring stretch    Consulted and Agree with Plan of Care  Patient       Patient will benefit from skilled therapeutic intervention in order to improve the following deficits and impairments:     Visit Diagnosis: Difficulty in walking, not elsewhere classified  Chronic right-sided low back pain without sciatica  Other disturbances of skin sensation     Problem List Patient Active Problem List   Diagnosis Date Noted  . Anemia 01/23/2016  . LGSIL on Pap smear of cervix 08/19/2014  . Vaginitis and vulvovaginitis 06/25/2014  . 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 Supervision of normal pregnancy 07/02/2010   Melvenia Needles, PTA supervised Fuller Mandril, SPTA during treatment session.  Melvenia Needles, PTA 05/16/2018, 5:29 PM  [facility] [address] [place], [place], [address] Phone: [phone]   Fax:  [phone]  Name: Lori Vaughan MRN: [id number] Date of Birth: [birth year]

## 2018-05-19 ENCOUNTER — Ambulatory Visit: Payer: Self-pay | Admitting: Physical Therapy

## 2018-05-23 ENCOUNTER — Ambulatory Visit: Payer: Self-pay | Attending: Nurse Practitioner | Admitting: Physical Therapy

## 2018-05-23 ENCOUNTER — Encounter: Payer: Self-pay | Admitting: Physical Therapy

## 2018-05-23 DIAGNOSIS — R262 Difficulty in walking, not elsewhere classified: Secondary | ICD-10-CM | POA: Insufficient documentation

## 2018-05-23 DIAGNOSIS — M545 Low back pain: Secondary | ICD-10-CM | POA: Insufficient documentation

## 2018-05-23 DIAGNOSIS — R208 Other disturbances of skin sensation: Secondary | ICD-10-CM | POA: Insufficient documentation

## 2018-05-23 DIAGNOSIS — G8929 Other chronic pain: Secondary | ICD-10-CM | POA: Insufficient documentation

## 2018-05-23 NOTE — Patient Instructions (Signed)
Mini Squat: Double Leg    Con las piernas separadas al ancho de los hombros, inclnese hacia delante para mantener el equilibrio y Clinical biochemist una mini cuclilla. Mantenga las rodillas en lnea con el segundo dedo del pie. Las rodillas no deben sobrepasar los dedos del pie. Repita _5 veces por rutina. Descanse _10__ segundos despus de cada rutina. Realice 1-2___ rutinas por sesin. Green band http://plyo.exer.us/70   Copyright  VHI. All rights reserved.

## 2018-05-24 NOTE — Therapy (Signed)
Lori Vaughan, [place], [address] Phone: [phone]   Fax:  [phone]  Physical Therapy Treatment  Patient Details  Name: Lori Vaughan MRN: [id number] Date of Birth: [birth year] Referring Provider (PT): Geryl Rankins , NP    Encounter Date: 05/23/2018  PT End of Session - 05/23/18 1631    Visit Number  5    Number of Visits  12    Date for PT Re-Evaluation  06/06/18    PT Start Time  1548    PT Stop Time  1628    PT Time Calculation (min)  40 min    Activity Tolerance  Patient tolerated treatment well    Behavior During Therapy  [facility] for tasks assessed/performed       Past Medical History:  Diagnosis Date  . 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 Postpartum depression    postpartum depression after delivery of youngest son (age 24); depression outside pregnancy, treated in the past with antidepressant (unsure of med).  Not on meds now.    Past Surgical History:  Procedure Laterality Date  . [place], 2005, 2012   x4  . TUBAL LIGATION     2012    There were no vitals filed for this visit.  Subjective Assessment - 05/23/18 1547    Currently in Pain?  Yes    Pain Score  5     Pain Location  Hip    Pain Orientation  Right    Pain Descriptors / Indicators  Discomfort    Pain Type  Chronic pain    Pain Radiating Towards  back    Pain Frequency  Intermittent    Aggravating Factors   longer standing,   vacume    Pain Relieving Factors  rest  exercise    Effect of Pain on Daily Activities  ADL on feet  is limited    Pain Score  --   no number given  mild   Pain Location  Back   mid back   Pain Orientation  Mid    Pain Descriptors / Indicators  --   bothers likei slept wrong   Pain Type  Acute pain    Pain Frequency  Intermittent    Aggravating Factors   trunk rotation    Pain Relieving Factors  not sure.                        [facility] Adult PT Treatment/Exercise - 05/23/18 0001      Lumbar Exercises: Stretches   Press Ups  5 reps    Press Ups Limitations  feels good      Lumbar Exercises: Aerobic   Recumbent Bike  5 minutes L3      Lumbar Exercises: Prone   Opposite Arm/Leg Raise  10 reps;1 second   with multifitus press   Other Prone Lumbar Exercises  multifitus press ,  press with alternating knee flexion,    Other Prone Lumbar Exercises  ball squeeze 10 X      Knee/Hip Exercises: Standing   Functional Squat  5 reps;2 sets    Functional Squat Limitations  2 different heights  with green band  hip abduction  no knee pain    Gait Training  YOGA walking.      Knee/Hip Exercises: Seated   Sit to Sand  5 reps  with green band     Knee/Hip Exercises: Supine   Bridges  10 reps             PT Education - 05/23/18 1629    Education Details  HEP,      Person(s) Educated  Patient    Methods  Explanation;Demonstration;Tactile cues;Verbal cues;Handout    Comprehension  Verbalized understanding;Returned demonstration          PT Long Term Goals - 05/23/18 1557      PT LONG TERM GOAL #1   Title  Patient will be able to stand to do light housework, dishes for 15 min with no pain in Rt LE     Baseline  no more leg pain    Time  6    Period  Weeks    Status  Achieved      PT LONG TERM GOAL #2   Title  Pt will be I with HEP for core, hip strength and flexibility upon discharge    Baseline    independent with exercises issued so far.    Time  6    Period  Weeks    Status  On-going      PT LONG TERM GOAL #3   Title  Pt will be able to improve her ability to transition to standing, roll, supine to sit without increasing back pain     Baseline  less pain.    70% improvement.     Time  6    Period  Weeks    Status  Partially Met      PT LONG TERM GOAL #4   Title  Pt will be able to squat, lift from the floor with good form to prevent reinjury.      Baseline  not doing     Time  6    Period  Weeks    Status  On-going      PT LONG TERM GOAL #5   Title  Pt will be able to walk through the grocery store for 30 min with min increase in hip, back pain     Baseline  has not tried  last few days    Time  6    Period  Weeks    Status  On-going            Plan - 05/23/18 1648    Clinical Impression Statement  Progress functionally improving.   She was able to squat with green band   and no pain.  No hip pain at end of session.      PT Next Visit Plan  check  to see how bike at home went,  squats  consider lifting.    prone stretch and  stabilization , (piriformis) , manual/STW, [place] .  Core .  Check new exercise.  check to see if pelvis is level may need muscle energy prior to stabilization.   Add prone exercises    PT Home Exercise Plan  piriformis, extension POE/childs pose ,  hip flexor/ quad stretch,  hamstring stretch,  mini squat.    Consulted and Agree with Plan of Care  Patient       Patient will benefit from skilled therapeutic intervention in order to improve the following deficits and impairments:     Visit Diagnosis: Difficulty in walking, not elsewhere classified  Chronic right-sided low back pain without sciatica  Other disturbances of skin sensation     Problem List Patient Active Problem List  Diagnosis Date Noted  . Anemia 01/23/2016  . LGSIL on Pap smear of cervix 08/19/2014  . Vaginitis and vulvovaginitis 06/25/2014  . 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 Supervision of normal pregnancy 07/02/2010    ,  PTA 05/24/2018, 8:14 AM  [place], [place], [address] Phone: [phone]   Fax:  [phone]  Name: Lori Vaughan MRN: [id number] Date of Birth: 02-21-1981

## 2018-05-26 ENCOUNTER — Encounter: Payer: Self-pay | Admitting: Physical Therapy

## 2018-05-26 ENCOUNTER — Ambulatory Visit: Payer: Self-pay | Admitting: Physical Therapy

## 2018-05-26 DIAGNOSIS — M545 Low back pain, unspecified: Secondary | ICD-10-CM

## 2018-05-26 DIAGNOSIS — R208 Other disturbances of skin sensation: Secondary | ICD-10-CM

## 2018-05-26 DIAGNOSIS — G8929 Other chronic pain: Secondary | ICD-10-CM

## 2018-05-26 DIAGNOSIS — R262 Difficulty in walking, not elsewhere classified: Secondary | ICD-10-CM

## 2018-05-26 NOTE — Therapy (Signed)
[name], [place], [address] Phone: [phone]   Fax:  [phone]  Physical Therapy Treatment  Patient Details  Name: Lori Vaughan MRN: [id number] Date of Birth: Sep 09, 1980 Referring Provider (PT): Geryl Rankins , NP    Encounter Date: 05/26/2018  PT End of Session - 05/26/18 1636    Visit Number  6    Number of Visits  12    Date for PT Re-Evaluation  06/06/18    PT Start Time  6720    PT Stop Time  1628    PT Time Calculation (min)  48 min    Activity Tolerance  Patient tolerated treatment well    Behavior During Therapy  [facility] for tasks assessed/performed       Past Medical History:  Diagnosis Date  . 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 Postpartum depression    postpartum depression after delivery of youngest son (age 2); depression outside pregnancy, treated in the past with antidepressant (unsure of med).  Not on meds now.    Past Surgical History:  Procedure Laterality Date  . [place], 2005, 2012   x4  . TUBAL LIGATION     2012    There were no vitals filed for this visit.  Subjective Assessment - 05/26/18 1550    Subjective  No pain.    Pressure  with standing extension.    Extension exercises helpful    Currently in Pain?  No/denies                       [facility] Adult PT Treatment/Exercise - 05/26/18 0001      Lumbar Exercises: Stretches   Standing Extension  3 reps;10 reps    Standing Extension Limitations  no pain when leaning with counter at back    Press Ups  5 reps    Other Lumbar Stretch Exercise  childs pose      Lumbar Exercises: Machines for Strengthening   Leg Press  3 sets,  10 reps :  1 set 1 plate,  , 1 set 1 plate and yellow band to improve hip alignment and 1 set 2 pltes and band      Lumbar Exercises: Standing   Wall Slides  10 reps    Wall Slides  Limitations  HEP  cued initially    Other Standing Lumbar Exercises  hip hinge education with and without pole for technique.  needed moderate cues initially.  Some mild pain  in low back.      Lumbar Exercises: Prone   Opposite Arm/Leg Raise  10 reps;1 second   with multifitus press   Other Prone Lumbar Exercises  ball squeeze 10 X      Manual Therapy   Soft tissue mobilization  prone PROM hip Ir/ER  with press and trap piriformis right  ROM improved.  Pain relieved with P/A hip at greater trochanter.              PT Education - 05/26/18 1630    Education Details  Exercise form,  HEP    Person(s) Educated  Patient    Methods  Explanation;Demonstration;Tactile cues;Handout;Verbal cues    Comprehension  Returned demonstration;Verbalized understanding          PT Long Term Goals - 05/23/18 1557      PT LONG TERM GOAL #1  Title  Patient will be able to stand to do light housework, dishes for 15 min with no pain in Rt LE     Baseline  no more leg pain    Time  6    Period  Weeks    Status  Achieved      PT LONG TERM GOAL #2   Title  Pt will be I with HEP for core, hip strength and flexibility upon discharge    Baseline    independent with exercises issued so far.    Time  6    Period  Weeks    Status  On-going      PT LONG TERM GOAL #3   Title  Pt will be able to improve her ability to transition to standing, roll, supine to sit without increasing back pain     Baseline  less pain.    70% improvement.     Time  6    Period  Weeks    Status  Partially Met      PT LONG TERM GOAL #4   Title  Pt will be able to squat, lift from the floor with good form to prevent reinjury.     Baseline  not doing     Time  6    Period  Weeks    Status  On-going      PT LONG TERM GOAL #5   Title  Pt will be able to walk through the grocery store for 30 min with min increase in hip, back pain     Baseline  has not tried  last few days    Time  6    Period  Weeks    Status   On-going            Plan - 05/26/18 1637    Clinical Impression Statement  Extension based exercises and stretches continue to be helpful.  She is able to ride stationary bike at home for cardio without pain increase. She is not able to functionally squat or hip hinge correctly without increasing pain.      PT Next Visit Plan   check wall squat,  consider more leg press to help  prepare for squats  consider lifting.    prone stretch and  stabilization , (piriformis) , manual/STW, [place] .  Core .  Check new exercise.  check to see if pelvis is level may need muscle energy prior to stabilization.   Add prone exercises    PT Home Exercise Plan  piriformis, extension POE/childs pose ,  hip flexor/ quad stretch,  hamstring stretch,  mini squat.  wall sit    Consulted and Agree with Plan of Care  Patient       Patient will benefit from skilled therapeutic intervention in order to improve the following deficits and impairments:     Visit Diagnosis: Difficulty in walking, not elsewhere classified  Chronic right-sided low back pain without sciatica  Other disturbances of skin sensation     Problem List Patient Active Problem List   Diagnosis Date Noted  . Anemia 01/23/2016  . LGSIL on Pap smear of cervix 08/19/2014  . Vaginitis and vulvovaginitis 06/25/2014  . 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 Supervision of normal pregnancy 07/02/2010    Lori Vaughan   PTA 05/26/2018, 4:40 PM  [facility] [address] [place], [place], [address] Phone: [phone]   Fax:  [phone]  Name: Lori Vaughan MRN: [id number] Date of  Birth: [birth year]

## 2018-05-26 NOTE — Patient Instructions (Signed)
Wall-Sit    Con la espalada presionada contra una pared, flexione las rodillas mientras desliza los glteos hacia abajo hasta que las rodillas se encuentren a 90 o ms. Mantenga esta posicin durante _10__ respiraciones. Exhale mientras extiende las piernas. Repita _10__ veces. Realice 1___ veces al da.  Copyright  VHI. All rights reserved.

## 2018-06-02 ENCOUNTER — Ambulatory Visit: Payer: Self-pay | Admitting: Physical Therapy

## 2018-06-02 DIAGNOSIS — M545 Low back pain, unspecified: Secondary | ICD-10-CM

## 2018-06-02 DIAGNOSIS — R208 Other disturbances of skin sensation: Secondary | ICD-10-CM

## 2018-06-02 DIAGNOSIS — G8929 Other chronic pain: Secondary | ICD-10-CM

## 2018-06-02 DIAGNOSIS — R262 Difficulty in walking, not elsewhere classified: Secondary | ICD-10-CM

## 2018-06-02 NOTE — Therapy (Signed)
[place], [place], [address] Phone: [phone]   Fax:  [phone]  Physical Therapy Treatment/Discharge   Patient Details  Name: Lori Vaughan MRN: [id number] Date of Birth: Feb 07, 1981 Referring Provider (PT): Geryl Rankins , NP    Encounter Date: 06/02/2018  PT End of Session - 06/02/18 1551    Visit Number  7    Number of Visits  12    Date for PT Re-Evaluation  06/06/18    PT Start Time  1550    PT Stop Time  1630    PT Time Calculation (min)  40 min    Activity Tolerance  Patient tolerated treatment well    Behavior During Therapy  [facility] for tasks assessed/performed       Past Medical History:  Diagnosis Date  . 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 Postpartum depression    postpartum depression after delivery of youngest son (age 56); depression outside pregnancy, treated in the past with antidepressant (unsure of med).  Not on meds now.    Past Surgical History:  Procedure Laterality Date  . [place], 2005, 2012   x4  . TUBAL LIGATION     2012    There were no vitals filed for this visit.  Subjective Assessment - 06/02/18 1551    Subjective  I have been good all this week.  I have been doing 20 squats a day.          [facility] PT Assessment - 06/02/18 0001      AROM   Lumbar Flexion  touches toes    Lumbar Extension  WNL feels good     Lumbar - Right Side Bend  WNL    Lumbar - Left Side Bend  WNL    Lumbar - Right Rotation  WNL    Lumbar - Left Rotation  WNL       Strength   Right Hip Extension  5/5    Right Hip ABduction  5/5    Left Hip Extension  5/5    Left Hip ABduction  5/5         [facility] Adult PT Treatment/Exercise - 06/02/18 0001      Self-Care   Self-Care  Other Self-Care Comments    Other Self-Care Comments   safe core ex, body mechanics, POC       Lumbar Exercises:  Stretches   Press Ups  5 reps      Lumbar Exercises: Quadruped   [place]/Old Horse  5 reps    Opposite Arm/Leg Raise  Right arm/Left leg;Left arm/Right leg;10 reps    Plank  in quadruped and on elbows , mod cues     Other Quadruped Lumbar Exercises  childs pose x 1     Other Quadruped Lumbar Exercises  bird dog with knee/UE in extension       Knee/Hip Exercises: Aerobic   Stationary Bike  6 min L3 end of session                   PT Long Term Goals - 06/02/18 1553      PT LONG TERM GOAL #1   Title  Patient will be able to stand to do light housework, dishes for 15 min with no pain in Rt LE     Status  Achieved      PT LONG TERM  GOAL #2   Title  Pt will be I with HEP for core, hip strength and flexibility upon discharge    Status  Achieved      PT LONG TERM GOAL #3   Title  Pt will be able to improve her ability to transition to standing, roll, supine to sit without increasing back pain     Status  Achieved      PT LONG TERM GOAL #4   Title  Pt will be able to squat, lift from the floor with good form to prevent reinjury.     Status  Achieved      PT LONG TERM GOAL #5   Title  Pt will be able to walk through the grocery store for 30 min with min increase in hip, back pain     Status  Achieved            Plan - 06/02/18 1555    Clinical Impression Statement  Pt has met her goals and is motivated to continue working out, getting strong.  She needed min cues and was shown some alternatives to squatting, (sumo, against wal) to decrease discomfort.     PT Treatment/Interventions  Taping;ADLs/Self Care Home Management;Cryotherapy;Electrical Stimulation;Traction;Ultrasound;Therapeutic exercise;Patient/family education;Manual techniques;Dry needling;Balance training;Functional mobility training;Iontophoresis 56m/ml Dexamethasone;Moist Heat;Neuromuscular re-education    PT Next Visit Plan  DC     PT Home Exercise Plan  piriformis, extension POE/childs pose ,  hip  flexor/ quad stretch,  hamstring stretch,  mini squat.  wall sit.     Consulted and Agree with Plan of Care  Patient       Patient will benefit from skilled therapeutic intervention in order to improve the following deficits and impairments:  Decreased activity tolerance, Decreased strength, Increased fascial restricitons, Impaired UE functional use, Postural dysfunction, Pain, Decreased range of motion, Decreased balance, Decreased endurance, Decreased mobility, Difficulty walking, Improper body mechanics  Visit Diagnosis: Difficulty in walking, not elsewhere classified  Chronic right-sided low back pain without sciatica  Other disturbances of skin sensation     Problem List Patient Active Problem List   Diagnosis Date Noted  . Anemia 01/23/2016  . LGSIL on Pap smear of cervix 08/19/2014  . Vaginitis and vulvovaginitis 06/25/2014  . 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 Supervision of normal pregnancy 07/02/2010    PAA,JENNIFER 06/02/2018, 4:21 PM  [facility]GS[address] [place] [address]Phone: [phone]  Fax:  [phone] Name: Lori DenaMRN: [id number] of Birth: 1Aug 31, 1982  PHYSICAL THERAPY DISCHARGE SUMMARY  Visits from Start of Care: 7  Current functional level related to goals / functional outcomes: See above , goals met     Remaining deficits: None limiting function   Education / Equipment: HEP, core , body mechanics  Plan: Patient agrees to discharge.  Patient goals were met. Patient is being discharged due to meeting the stated rehab goals.  ?????     JRaeford Razor PT 06/02/18 4:21 PM Phone: [phone]: [phone]

## 2019-08-07 ENCOUNTER — Ambulatory Visit: Payer: Self-pay

## 2019-08-07 ENCOUNTER — Other Ambulatory Visit: Payer: Self-pay

## 2019-08-18 ENCOUNTER — Ambulatory Visit: Payer: Self-pay | Admitting: Nurse Practitioner

## 2022-03-18 DIAGNOSIS — M4726 Other spondylosis with radiculopathy, lumbar region: Secondary | ICD-10-CM | POA: Diagnosis not present

## 2023-10-21 ENCOUNTER — Telehealth: Payer: Self-pay | Admitting: *Deleted

## 2023-10-21 NOTE — Telephone Encounter (Signed)
 Communication  Reason for CRM: Patient was wondering if Philippe would be willing to send in a basic panel for blood work that she can complete before her appointment on July 15th. I did let her know that this appointment is to establish care and for Philippe to get to know her so there isn't a guarantee that this request will be completed.        Please call patient back with an update on whether this is, or is not possible.

## 2023-11-02 ENCOUNTER — Ambulatory Visit (INDEPENDENT_AMBULATORY_CARE_PROVIDER_SITE_OTHER)

## 2023-11-02 ENCOUNTER — Ambulatory Visit (INDEPENDENT_AMBULATORY_CARE_PROVIDER_SITE_OTHER): Payer: Self-pay | Admitting: Family Medicine

## 2023-11-02 ENCOUNTER — Encounter: Payer: Self-pay | Admitting: Family Medicine

## 2023-11-02 VITALS — BP 110/68 | HR 82 | Temp 99.1°F | Ht 64.0 in | Wt 154.0 lb

## 2023-11-02 DIAGNOSIS — M25561 Pain in right knee: Secondary | ICD-10-CM | POA: Diagnosis not present

## 2023-11-02 DIAGNOSIS — M5137 Other intervertebral disc degeneration, lumbosacral region with discogenic back pain only: Secondary | ICD-10-CM | POA: Diagnosis not present

## 2023-11-02 DIAGNOSIS — G8929 Other chronic pain: Secondary | ICD-10-CM

## 2023-11-02 DIAGNOSIS — E663 Overweight: Secondary | ICD-10-CM

## 2023-11-02 DIAGNOSIS — M2550 Pain in unspecified joint: Secondary | ICD-10-CM | POA: Diagnosis not present

## 2023-11-02 DIAGNOSIS — M545 Low back pain, unspecified: Secondary | ICD-10-CM | POA: Diagnosis not present

## 2023-11-02 DIAGNOSIS — Z1159 Encounter for screening for other viral diseases: Secondary | ICD-10-CM

## 2023-11-02 DIAGNOSIS — L819 Disorder of pigmentation, unspecified: Secondary | ICD-10-CM

## 2023-11-02 DIAGNOSIS — M4807 Spinal stenosis, lumbosacral region: Secondary | ICD-10-CM | POA: Diagnosis not present

## 2023-11-02 DIAGNOSIS — M25551 Pain in right hip: Secondary | ICD-10-CM | POA: Diagnosis not present

## 2023-11-02 DIAGNOSIS — Z124 Encounter for screening for malignant neoplasm of cervix: Secondary | ICD-10-CM

## 2023-11-02 DIAGNOSIS — Z7689 Persons encountering health services in other specified circumstances: Secondary | ICD-10-CM

## 2023-11-02 LAB — CBC WITH DIFFERENTIAL/PLATELET
Basophils Absolute: 0 K/uL (ref 0.0–0.1)
Basophils Relative: 1.4 % (ref 0.0–3.0)
Eosinophils Absolute: 0 K/uL (ref 0.0–0.7)
Eosinophils Relative: 0.8 % (ref 0.0–5.0)
HCT: 35.1 % — ABNORMAL LOW (ref 36.0–46.0)
Hemoglobin: 11 g/dL — ABNORMAL LOW (ref 12.0–15.0)
Lymphocytes Relative: 30 % (ref 12.0–46.0)
Lymphs Abs: 1.1 K/uL (ref 0.7–4.0)
MCHC: 31.2 g/dL (ref 30.0–36.0)
MCV: 70.7 fl — ABNORMAL LOW (ref 78.0–100.0)
Monocytes Absolute: 0.4 K/uL (ref 0.1–1.0)
Monocytes Relative: 10.1 % (ref 3.0–12.0)
Neutro Abs: 2.1 K/uL (ref 1.4–7.7)
Neutrophils Relative %: 57.7 % (ref 43.0–77.0)
Platelets: 371 K/uL (ref 150.0–400.0)
RBC: 4.97 Mil/uL (ref 3.87–5.11)
RDW: 18 % — ABNORMAL HIGH (ref 11.5–15.5)
WBC: 3.6 K/uL — ABNORMAL LOW (ref 4.0–10.5)

## 2023-11-02 LAB — COMPREHENSIVE METABOLIC PANEL WITH GFR
ALT: 12 U/L (ref 0–35)
AST: 16 U/L (ref 0–37)
Albumin: 4.4 g/dL (ref 3.5–5.2)
Alkaline Phosphatase: 85 U/L (ref 39–117)
BUN: 12 mg/dL (ref 6–23)
CO2: 28 meq/L (ref 19–32)
Calcium: 9 mg/dL (ref 8.4–10.5)
Chloride: 103 meq/L (ref 96–112)
Creatinine, Ser: 0.49 mg/dL (ref 0.40–1.20)
GFR: 116.15 mL/min (ref >60.00)
Glucose, Bld: 121 mg/dL — ABNORMAL HIGH (ref 70–99)
Potassium: 4.1 meq/L (ref 3.5–5.1)
Sodium: 138 meq/L (ref 135–145)
Total Bilirubin: 0.5 mg/dL (ref 0.2–1.2)
Total Protein: 7.7 g/dL (ref 6.0–8.3)

## 2023-11-02 LAB — LIPID PANEL
Cholesterol: 212 mg/dL — ABNORMAL HIGH (ref 0–200)
HDL: 47.9 mg/dL (ref >39.00)
LDL Cholesterol: 108 mg/dL — ABNORMAL HIGH (ref 0–99)
NonHDL: 163.75
Total CHOL/HDL Ratio: 4
Triglycerides: 277 mg/dL — ABNORMAL HIGH (ref 0.0–149.0)
VLDL: 55.4 mg/dL — ABNORMAL HIGH (ref 0.0–40.0)

## 2023-11-02 LAB — SEDIMENTATION RATE: Sed Rate: 34 mm/h — ABNORMAL HIGH (ref 0–20)

## 2023-11-02 LAB — TSH: TSH: 1.25 u[IU]/mL (ref 0.35–5.50)

## 2023-11-02 LAB — HEMOGLOBIN A1C: Hgb A1c MFr Bld: 5.8 % (ref 4.6–6.5)

## 2023-11-02 MED ORDER — MELOXICAM 7.5 MG PO TABS: 7.5000 mg | ORAL_TABLET | Freq: Every day | ORAL | 0 refills | Status: DC

## 2023-11-02 NOTE — Progress Notes (Signed)
 New Patient Office Visit  Subjective   Patient ID: Lori Vaughan, female    DOB: 08/07/1980  Age: 43 y.o. MRN: [id number]  CC:  Chief Complaint  Patient presents with   Establish Care    HPI Lori Vaughan presents to establish care with new provider.   Patients previous primary care provider: [facility] with Haze Servant, NP.   Specialist: None   Patient is complaining of joint pain, such as lumbar spine, shoulders, hips, knees. She reports this started 2019, gradually. Lumbar spine started in 2019, she went to physical therapy in 2020 with some relief. In 2019, had a lumbar spine x-ray that was negative. Over the years her pain has become worse. Daily pain. Described as sharp, burning. Some movements make the pain worse and walking a lot makes pain worse. Ice packs and Advil  seem to help the pain, except in right hip. She reports her knees will swell. Denies any recent or old injuries. She reports she has seen a chiropractor last year.   Patient is concerned about discoloration on face. She reports she has dark spots on her face.   Outpatient Encounter Medications as of 11/02/2023  Medication Sig   Ascorbic Acid (VITAMIN C PO) Take by mouth.   ASHWAGANDHA PO Take by mouth.   Collagen-Vitamin C-Biotin (COLLAGEN PO) Take by mouth.   ibuprofen  (ADVIL ,MOTRIN ) 800 MG tablet Take 1 tablet (800 mg total) by mouth every 8 (eight) hours as needed.   MAGNESIUM PO Take by mouth.   meloxicam  (MOBIC ) 7.5 MG tablet Take 1 tablet (7.5 mg total) by mouth daily.   Vitamin D-Vitamin K (VITAMIN K2-VITAMIN D3 PO) Take by mouth.   [DISCONTINUED] amitriptyline  (ELAVIL ) 25 MG tablet Take 1 tablet (25 mg total) by mouth at bedtime.   [DISCONTINUED] ferrous sulfate  325 (65 FE) MG tablet Take 1 tablet (325 mg total) by mouth 2 (two) times daily with a meal. (Patient not taking: Reported on 04/08/2018)   [DISCONTINUED] Melatonin 1 MG CAPS Take 1 mg by mouth.    [DISCONTINUED] tiZANidine  (ZANAFLEX ) 4 MG tablet Take 1 tablet (4 mg total) by mouth every 6 (six) hours as needed for muscle spasms.   No facility-administered encounter medications on file as of 11/02/2023.    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  Not on meds now.    Past Surgical History:  Procedure Laterality Date   CESAREAN SECTION  1998, 1999, 2005, 2012   x4   TUBAL LIGATION     2012    Family History  Problem Relation Age of Onset   Breast cancer Maternal Aunt        not sure of age at time of diagnosis   Breast cancer Maternal Aunt        not sure of age at diagnosis   Alcohol abuse Father    Pancreatitis Father        alcoholic--cause of death   Depression Brother        anxiety and depression, drug abuse   Drug abuse Brother    Autism Son     Social History   Socioeconomic History   Marital status: Married    Spouse name: Donley  Number of children: 4   Years of education: 6   Highest education level: Not on file  Occupational History   Occupation: Housewife  Tobacco Use   Smoking status: Never   Smokeless tobacco: Never  Vaping Use   Vaping status: Never Used  Substance and Sexual Activity   Alcohol use: No   Drug use: No   Sexual activity: Yes    Birth control/protection: Surgical  Other Topics Concern   Not on file  Social History Narrative   Originally from [place]   Came to [employer]. In 2001   Lives at home with husband and 4 children   Social Drivers of Health   Financial Resource Strain: Low Risk  (11/02/2023)   Overall Financial Resource Strain (CARDIA)    Difficulty of Paying Living Expenses: Not hard at all  Food Insecurity: No Food Insecurity (11/02/2023)   Hunger  Vital Sign    Worried About Running Out of Food in the Last Year: Never true    Ran Out of Food in the Last Year: Never true  Transportation Needs: No Transportation Needs (11/02/2023)   PRAPARE - Administrator, Civil Service (Medical): No    Lack of Transportation (Non-Medical): No  Physical Activity: Inactive (11/02/2023)   Exercise Vital Sign    Days of Exercise per Week: 0 days    Minutes of Exercise per Session: 0 min  Stress: No Stress Concern Present (11/02/2023)   [employer] of Occupational Health - Occupational Stress Questionnaire    Feeling of Stress: Only a little  Social Connections: Moderately Isolated (11/02/2023)   Social Connection and Isolation Panel    Frequency of Communication with Friends and Family: More than three times a week    Frequency of Social Gatherings with Friends and Family: Once a week    Attends Religious Services: Never    Database administrator or Organizations: No    Attends Banker Meetings: Never    Marital Status: Married  Catering manager Violence: Not At Risk (11/02/2023)   Humiliation, Afraid, Rape, and Kick questionnaire    Fear of Current or Ex-Partner: No    Emotionally Abused: No    Physically Abused: No    Sexually Abused: No    ROS See HPI above    Objective  BP 110/68   Pulse 82   Temp 99.1 F (37.3 C) (Oral)   Ht 5' 4 (1.626 m)   Wt 154 lb (69.9 kg)   LMP 09/30/2023 (Exact Date)   SpO2 98%   BMI 26.43 kg/m   Physical Exam Vitals reviewed.  Constitutional:      General: She is not in acute distress.    Appearance: Normal appearance. She is not ill-appearing, toxic-appearing or diaphoretic.  Eyes:     General:        Right eye: No discharge.        Left eye: No discharge.     Conjunctiva/sclera: Conjunctivae normal.  Cardiovascular:     Rate and Rhythm: Normal rate and regular rhythm.     Heart sounds: Normal heart sounds. No murmur heard.    No friction rub. No gallop.   Pulmonary:     Effort: Pulmonary effort is normal. No respiratory distress.     Breath sounds: Normal breath sounds.  Musculoskeletal:        General: No swelling. Normal range of motion.     Right shoulder: No swelling. Normal range of motion.  Left shoulder: No swelling. Normal range of motion.     Thoracic back: No tenderness.     Lumbar back: No tenderness.     Right knee: Swelling and crepitus present. Normal range of motion.     Left knee: Crepitus present. Normal range of motion.  Skin:    General: Skin is warm and dry.  Neurological:     General: No focal deficit present.     Mental Status: She is alert and oriented to person, place, and time. Mental status is at baseline.  Psychiatric:        Mood and Affect: Mood normal.        Behavior: Behavior normal.        Thought Content: Thought content normal.        Judgment: Judgment normal.       Assessment & Plan:  Multiple joint pain -     CBC with Differential/Platelet -     Comprehensive metabolic panel with GFR -     Rheumatoid factor -     Sedimentation rate -     Cyclic citrul peptide antibody, IgG -     ANA -     Meloxicam ; Take 1 tablet (7.5 mg total) by mouth daily.  Dispense: 30 tablet; Refill: 0  Lumbar spine pain -     DG Lumbar Spine 2-3 Views; Future -     Meloxicam ; Take 1 tablet (7.5 mg total) by mouth daily.  Dispense: 30 tablet; Refill: 0  Right hip pain -     DG HIP UNILAT W OR W/O PELVIS 2-3 VIEWS RIGHT; Future -     Meloxicam ; Take 1 tablet (7.5 mg total) by mouth daily.  Dispense: 30 tablet; Refill: 0  Chronic pain of right knee -     DG Knee 1-2 Views Right; Future -     Meloxicam ; Take 1 tablet (7.5 mg total) by mouth daily.  Dispense: 30 tablet; Refill: 0  Discoloration of skin of face -     Ambulatory referral to Dermatology  Overweight (BMI 25.0-29.9) -     CBC with Differential/Platelet -     Comprehensive metabolic panel with GFR -     Hemoglobin A1c -     Lipid panel -      TSH  Cervical cancer screening -     Ambulatory referral to Gynecology  Need for hepatitis C screening test -     Hepatitis C antibody  Encounter to establish care   1.Review health maintenance: -Cervical cancer screening: Referral to GYN -Covid booster: Declines  -Tdap vaccine: Declines  -Hep B vaccine: Unknown -Hep C screening: Ordered  -HPV vaccine: Not had  -Mammogram: Declines  2. Ordered labs (CBC, CMP, A1c, lipids-not fasting, TSH, RA, Sed Rate, Anti CCP, and ANA) for complaints of multiple joint pain and based on BMI-obesity.  3.Placed a referral to dermatology for discoloration of skin of face. SABRA  4.Ordered x-ray of lumbar spine, right hip, and right knee pain.  5. Prescribed Meloxicam  7.5mg  daily. You may take this medication every morning or every evening with something to eat. Please do not take any other NSAIDS (Advil , Ibuprofen , Aleve, or Naproxen) while taking this medication.  -Patient provided a DMV paper for a placecard. Holding this until further testing can be completed.  Return in about 3 weeks (around 11/23/2023) for follow-up.   Iven Earnhart, NP

## 2023-11-02 NOTE — Patient Instructions (Addendum)
-  It was nice to meet you and look forward to taking care of you. -Ordered labs. Office will call with lab results and will be available on MyChart. -Placed a referral to GYN for cervical cancer screening and dermatology for discoloration of skin of face. Please call the office or send a MyChart message if you do not receive a phone call or a MyChart message about appointment in 2 weeks.  -Ordered x-ray of lumbar spine, right hip, and right knee pain. Office will call with results and will be available on MyChart.  -Prescribed Meloxicam  7.5mg  daily. You may take this medication every morning or every evening with something to eat. Please do not take any other NSAIDS (Advil , Ibuprofen , Aleve, or Naproxen) while taking this medication.  -Follow up in 3 weeks.

## 2023-11-03 ENCOUNTER — Ambulatory Visit

## 2023-11-03 ENCOUNTER — Ambulatory Visit: Payer: Self-pay | Admitting: Family Medicine

## 2023-11-03 DIAGNOSIS — R768 Other specified abnormal immunological findings in serum: Secondary | ICD-10-CM

## 2023-11-03 DIAGNOSIS — M1990 Unspecified osteoarthritis, unspecified site: Secondary | ICD-10-CM

## 2023-11-03 DIAGNOSIS — D649 Anemia, unspecified: Secondary | ICD-10-CM

## 2023-11-03 DIAGNOSIS — M51369 Other intervertebral disc degeneration, lumbar region without mention of lumbar back pain or lower extremity pain: Secondary | ICD-10-CM

## 2023-11-03 LAB — IRON,TIBC AND FERRITIN PANEL
%SAT: 4 % — ABNORMAL LOW (ref 16–45)
Ferritin: 1 ng/mL — ABNORMAL LOW (ref 16–232)
Iron: 21 ug/dL — ABNORMAL LOW (ref 40–190)
TIBC: 493 ug/dL — ABNORMAL HIGH (ref 250–450)

## 2023-11-03 NOTE — Addendum Note (Signed)
 Addended by: ELNER NANNY B on: 11/03/2023 08:27 AM   Modules accepted: Orders

## 2023-11-04 ENCOUNTER — Ambulatory Visit: Payer: Self-pay | Admitting: Family Medicine

## 2023-11-04 DIAGNOSIS — D509 Iron deficiency anemia, unspecified: Secondary | ICD-10-CM

## 2023-11-06 LAB — RHEUMATOID FACTOR: Rheumatoid fact SerPl-aCnc: <10 [IU]/mL (ref <14)

## 2023-11-06 LAB — ANTI-NUCLEAR AB-TITER (ANA TITER): ANA Titer 1: 1:80 {titer} — ABNORMAL HIGH

## 2023-11-06 LAB — CYCLIC CITRUL PEPTIDE ANTIBODY, IGG: Cyclic Citrullin Peptide Ab: <16 U

## 2023-11-06 LAB — ANA: Anti Nuclear Antibody (ANA): POSITIVE — AB

## 2023-11-06 LAB — HEPATITIS C ANTIBODY: Hepatitis C Ab: NONREACTIVE

## 2023-11-08 NOTE — Addendum Note (Signed)
 Addended by: ELNER NANNY B on: 11/08/2023 01:51 PM   Modules accepted: Orders

## 2023-11-23 ENCOUNTER — Telehealth: Payer: Self-pay | Admitting: Physical Medicine and Rehabilitation

## 2023-11-23 DIAGNOSIS — R87612 Low grade squamous intraepithelial lesion on cytologic smear of cervix (LGSIL): Secondary | ICD-10-CM | POA: Diagnosis not present

## 2023-11-23 DIAGNOSIS — M5441 Lumbago with sciatica, right side: Secondary | ICD-10-CM | POA: Diagnosis not present

## 2023-11-23 DIAGNOSIS — Z1331 Encounter for screening for depression: Secondary | ICD-10-CM | POA: Diagnosis not present

## 2023-11-23 DIAGNOSIS — G8929 Other chronic pain: Secondary | ICD-10-CM | POA: Diagnosis not present

## 2023-11-23 DIAGNOSIS — D508 Other iron deficiency anemias: Secondary | ICD-10-CM | POA: Diagnosis not present

## 2023-11-23 DIAGNOSIS — M25561 Pain in right knee: Secondary | ICD-10-CM | POA: Diagnosis not present

## 2023-11-23 NOTE — Telephone Encounter (Signed)
 Patient called and wants to reschedule her appointment she cancelled. CB#[phone]

## 2023-11-24 ENCOUNTER — Ambulatory Visit: Admitting: Family Medicine

## 2023-11-29 ENCOUNTER — Ambulatory Visit: Admitting: Physical Medicine and Rehabilitation

## 2023-12-02 ENCOUNTER — Other Ambulatory Visit: Payer: Self-pay | Admitting: Family Medicine

## 2023-12-02 DIAGNOSIS — M545 Low back pain, unspecified: Secondary | ICD-10-CM

## 2023-12-02 DIAGNOSIS — M25551 Pain in right hip: Secondary | ICD-10-CM

## 2023-12-02 DIAGNOSIS — M2550 Pain in unspecified joint: Secondary | ICD-10-CM

## 2023-12-02 DIAGNOSIS — G8929 Other chronic pain: Secondary | ICD-10-CM

## 2023-12-09 ENCOUNTER — Ambulatory Visit: Admitting: Physical Medicine and Rehabilitation

## 2023-12-09 ENCOUNTER — Encounter: Payer: Self-pay | Admitting: Physical Medicine and Rehabilitation

## 2023-12-09 DIAGNOSIS — M5416 Radiculopathy, lumbar region: Secondary | ICD-10-CM

## 2023-12-09 DIAGNOSIS — M25561 Pain in right knee: Secondary | ICD-10-CM

## 2023-12-09 DIAGNOSIS — M7918 Myalgia, other site: Secondary | ICD-10-CM

## 2023-12-09 DIAGNOSIS — M25551 Pain in right hip: Secondary | ICD-10-CM

## 2023-12-09 DIAGNOSIS — M546 Pain in thoracic spine: Secondary | ICD-10-CM

## 2023-12-09 DIAGNOSIS — G8929 Other chronic pain: Secondary | ICD-10-CM

## 2023-12-09 NOTE — Progress Notes (Signed)
 Lori Vaughan - 43 y.o. female MRN [id number]  Date of birth: 1980/08/02  Office Visit Note: Visit Date: 12/09/2023 PCP: Donata Snowman, PA-C Referred by: Billy Philippe SAUNDERS, NP  Subjective: Chief Complaint  Patient presents with   Lower Back - Pain   Middle Back - Pain   HPI: Lori Vaughan is a 43 y.o. female who comes in today per the request of Philippe Billy, NP for evaluation of chronic, worsening and severe bilateral thoracic back pain radiating to lower back, right hip/groin and knee. Spanish interpreter at bedside. Pain ongoing for several years. Her pain worsens with movement and activity. 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 She describes her pain as sharp, stabbing and burning sensation, currently rates as 6 out of 10. Some relief of pain with home exercise regimen, rest and use of medications. Some relief of pain with Mobic . History of formal physical therapy/chiropractic treatments with minimal relief of pain. Recent lumbar radiographs show mild degenerative changes at L5-S1. Recent x-rays of right hip show preserved joint spacing. Recent right knee x-rays show question of subchondral cystic change in the patella. Otherwise unremarkable radiographs of the right knee. Patient denies focal weakness, numbness and tingling. No recent trauma or falls.      Review of Systems  Musculoskeletal:  Positive for back pain and myalgias.  Neurological:  Negative for tingling, sensory change, focal weakness and weakness.  All other systems reviewed and are negative.  Otherwise per HPI.  Assessment & Plan: Visit Diagnoses:    ICD-10-CM   1. Chronic bilateral thoracic back pain  M54.6    G89.29     2. Lumbar radiculopathy  M54.16     3. Pain in right hip  M25.551     4. Chronic pain of right knee  M25.561    G89.29     5. Myofascial pain syndrome  M79.18        Plan: Findings:  Chronic, worsening and severe bilateral  thoracic back pain radiating to lower back, right hip/groin and knee. Patient continues to have severe pain despite good conservative therapies such as formal physical therapy/chiropractic treatments and medications. Patients clinical presentation and exam are complex, differentials include lumbar radiculopathy and myofascial pain syndrome. Myofascial tenderness noted to bilateral thoracic and lumbar paraspinal muscles upon palpation. No pain noted with internal/external rotation of her hips. No joint line tenderness/effusion noted to right knee. I placed order for short course of physical therapy to address back, right hip and right knee pain. I also placed order for lumbar MRI imaging to rule out any structural issues that could be contributing to her symptoms. I did provide her with a work note placing her on light duty. She can continue with Mobic , can also try Volaren gel to right knee 3 times a day. I will see her back for lumbar MRI review and to discuss further treatment options. If we feel her right knee pain is isolated issue will refer to one of our orthopedic specialists. She has no questions at this time. No red flag symptoms noted upon exam today.     Meds & Orders: No orders of the defined types were placed in this encounter.  No orders of the defined types were placed in this encounter.   Follow-up: Return for Lumbar MRI review.   Procedures: No procedures performed      Clinical History: Narrative & Impression CLINICAL DATA:  Lumbar spine pain.   EXAM: LUMBAR SPINE -  2-3 VIEW   COMPARISON:  03/15/2018   FINDINGS: Five non-rib-bearing lumbar vertebra. The alignment is normal. Vertebral body heights are normal. Anterior spurring at multiple levels with mild L5-S1 disc space narrowing. No visible pars defects or focal bone abnormalities. Sacroiliac joints are normal. Tubal ligation clips in the pelvis.   IMPRESSION: Mild degenerative disc disease at L5-S1.      Electronically Signed   By: Andrea Gasman M.D.   On: 11/02/2023 19:09   She reports that she has never smoked. She has never used smokeless tobacco.  Recent Labs    11/02/23 1446  HGBA1C 5.8    Objective:  VS:  HT:    WT:   BMI:     BP:   HR: bpm  TEMP: ( )  RESP:  Physical Exam Vitals and nursing note reviewed.  HENT:     Head: Normocephalic and atraumatic.     Right Ear: External ear normal.     Left Ear: External ear normal.     Nose: Nose normal.     Mouth/Throat:     Mouth: Mucous membranes are moist.  Eyes:     Extraocular Movements: Extraocular movements intact.  Cardiovascular:     Rate and Rhythm: Normal rate.     Pulses: Normal pulses.  Pulmonary:     Effort: Pulmonary effort is normal.  Abdominal:     General: Abdomen is flat. There is no distension.  Musculoskeletal:        General: Tenderness present.     Cervical back: Normal range of motion.     Comments: Patient rises from seated position to standing without difficulty. Good lumbar range of motion. No pain noted with facet loading. 5/5 strength noted with bilateral hip flexion, knee flexion/extension, ankle dorsiflexion/plantarflexion and EHL. No clonus noted bilaterally. No pain upon palpation of greater trochanters. No pain with internal/external rotation of bilateral hips. Sensation intact bilaterally. Myofascial tenderness noted to bilateral thoracic and lumbar paraspinal regions upon palpation. Negative slump test bilaterally. Ambulates without aid, gait steady. No swelling, joint line tenderness noted to right knee.    Skin:    General: Skin is warm and dry.     Capillary Refill: Capillary refill takes less than 2 seconds.  Neurological:     General: No focal deficit present.     Mental Status: She is alert and oriented to person, place, and time.  Psychiatric:        Mood and Affect: Mood normal.        Behavior: Behavior normal.     Ortho Exam  Imaging: No results found.  Past  Medical/Family/Surgical/Social History: Medications & Allergies reviewed per EMR, new medications updated. 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  Not on meds now.   Family History  Problem Relation Age of Onset   Breast cancer Maternal Aunt        not sure of age at time of diagnosis   Breast cancer Maternal Aunt  not sure of age at diagnosis   Alcohol abuse Father    Pancreatitis Father        alcoholic--cause of death   Depression Brother        anxiety and depression, drug abuse   Drug abuse Brother    Autism Son    Past Surgical History:  Procedure Laterality Date   CESAREAN SECTION  1998, 1999, 2005, 2012   x4   TUBAL LIGATION     2012   Social History   Occupational History   Occupation: Housewife  Tobacco Use   Smoking status: Never   Smokeless tobacco: Never  Vaping Use   Vaping status: Never Used  Substance and Sexual Activity   Alcohol use: No   Drug use: No   Sexual activity: Yes    Birth control/protection: Surgical

## 2023-12-09 NOTE — Progress Notes (Signed)
 Pain Scale  Chronic pain Average Pain 6  Lower and mid back pain  Right hip pain Burning pain in hip Sharp pain in back  X-rays 11/02/23 No MRI PT with no relief Chiropractor little relief  Degeneration of intervertebral disc of lumbar region, unspecified whether pain present

## 2023-12-14 ENCOUNTER — Telehealth: Payer: Self-pay | Admitting: Physical Medicine and Rehabilitation

## 2023-12-14 ENCOUNTER — Encounter: Payer: Self-pay | Admitting: Physical Medicine and Rehabilitation

## 2023-12-14 NOTE — Telephone Encounter (Signed)
 Patient called and said that her job isn't accepting the letter [employer] wrote so she needs more information Like can she stand  or not and how long? They need the specifics of what she can or can not do. CB#[phone]

## 2023-12-21 ENCOUNTER — Ambulatory Visit

## 2023-12-21 ENCOUNTER — Ambulatory Visit
Admission: RE | Admit: 2023-12-21 | Discharge: 2023-12-21 | Disposition: A | Discharge status: Home / Self Care | Source: Ambulatory Visit

## 2023-12-21 ENCOUNTER — Other Ambulatory Visit: Payer: Self-pay | Admitting: Physical Medicine and Rehabilitation

## 2023-12-21 VITALS — BP 99/62 | HR 78 | Resp 14 | Ht 64.75 in | Wt 148.2 lb

## 2023-12-21 DIAGNOSIS — R0781 Pleurodynia: Secondary | ICD-10-CM

## 2023-12-21 DIAGNOSIS — R768 Other specified abnormal immunological findings in serum: Secondary | ICD-10-CM

## 2023-12-21 DIAGNOSIS — M545 Low back pain, unspecified: Secondary | ICD-10-CM | POA: Diagnosis not present

## 2023-12-21 DIAGNOSIS — M2480 Other specific joint derangements of unspecified joint, not elsewhere classified: Secondary | ICD-10-CM

## 2023-12-21 DIAGNOSIS — R0789 Other chest pain: Secondary | ICD-10-CM

## 2023-12-21 NOTE — Progress Notes (Signed)
 Office Visit Note  Patient: Lori Vaughan             Date of Birth: Jul 23, 1980           MRN: [id number]             PCP: Shepperson, Kirstin, PA-C Referring: Billy Philippe SAUNDERS, NP Visit Date: 12/21/2023 Occupation: @GUAROCC @  Subjective:  New Patient (Initial Visit) (Patient roomed with help from interpreter named Angelique. Patient states she has pain in her knees, hips, shoulder, lower back, and between her ribs/sternum area. Patient states she does have a lot of pain down her entire right side. Patient states she works using her hands a lot and crouching down a lot. Patient states she has to have a diagnoses to go back to work. Patient states she does at least 450 jobs a day. )   History of Present Illness: Lori Vaughan is a 43 y.o. female for positive ANA.  She admits to pain in low back, bilateral hips, and bilateral shoulders. She attributes this to her demanding job which requires frequent bending and lifting.   She admits to low back pain since 2016; she admits to feeling best in the morning, with mild AM stiffness lasting ~1 minute. She admits to feeling worse throughout the day. She denies any numbness/tingling in her legs. She denies any urinary or bowel incontinence.   Shoulders and hips started ~3 months ago. They also improve with rest and feel best in the morning, worse throughout the day.  She denies oral ulcers, rashes, photosensitivity, alopecia, raynaud's, dry eyes, dry mouth, fever, unexplainable weight loss, lymphadenopathy, GERD. She denies muscle aches. She denies joint swelling, only admits to persistent pain.  Patient admits to episodes of dislocating shoulder joints as a child. She also admits to being flexible as a child. She is worried because she has pain on her right chest wall that is tender with palpation.  She denies any family or personal hx of autoimmune disease.   Activities of Daily Living:  Patient reports morning stiffness  for 1 minutes.   Patient Reports nocturnal pain.  Difficulty dressing/grooming: Reports Difficulty climbing stairs: Reports Difficulty getting out of chair: Reports Difficulty using hands for taps, buttons, cutlery, and/or writing: Denies  Review of Systems  Constitutional:  Positive for fatigue.  HENT:  Negative for mouth sores and mouth dryness.   Eyes:  Negative for dryness.  Respiratory:  Negative for shortness of breath.   Cardiovascular:  Negative for chest pain and palpitations.  Gastrointestinal:  Negative for blood in stool, constipation and diarrhea.  Endocrine: Negative for increased urination.  Genitourinary:  Negative for involuntary urination.  Musculoskeletal:  Positive for joint pain, joint pain and morning stiffness. Negative for gait problem, joint swelling, myalgias, muscle weakness, muscle tenderness and myalgias.  Skin:  Negative for rash, hair loss and sensitivity to sunlight.  Allergic/Immunologic: Negative for susceptible to infections.  Neurological:  Negative for dizziness and headaches.  Hematological:  Negative for swollen glands.  Psychiatric/Behavioral:  Positive for depressed mood and sleep disturbance. The patient is nervous/anxious.      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  Not on meds now.    Family History  Problem Relation Age of Onset   Breast cancer Maternal Aunt        not sure of age at time of  diagnosis   Breast cancer Maternal Aunt        not sure of age at diagnosis   Alcohol abuse Father    Pancreatitis Father        alcoholic--cause of death   Depression Brother        anxiety and depression, drug abuse   Drug abuse Brother    Autism Son    Past Surgical History:  Procedure Laterality Date   CESAREAN SECTION  1998, 1999, 2005, 2012   x4   TUBAL LIGATION     2012   Social History   Social History Narrative   Originally from [place]   Came to [employer]. In 2001   Lives at home with husband and 4 children    There is no immunization history on file for this patient.   Objective: Vital Signs: BP 99/62 (BP Location: Right Arm, Patient Position: Sitting, Cuff Size: Normal)   Pulse 78   Resp 14   Ht 5' 4.75 (1.645 m)   Wt 148 lb 3.2 oz (67.2 kg)   LMP 12/04/2023   BMI 24.85 kg/m    Physical Exam Vitals and nursing note reviewed.  HENT:     Head: Normocephalic and atraumatic.     Nose: Nose normal.  Eyes:     Conjunctiva/sclera: Conjunctivae normal.     Pupils: Pupils are equal, round, and reactive to light.  Cardiovascular:     Rate and Rhythm: Normal rate and regular rhythm.     Heart sounds: Normal heart sounds.  Pulmonary:     Effort: Pulmonary effort is normal.     Breath sounds: Normal breath sounds.  Chest:     Chest wall: Tenderness (right distal ribcage w/ possible rib flare, tenderness to palpation) present.  Skin:    General: Skin is warm and dry.  Neurological:     Mental Status: She is alert. Mental status is at baseline.  Psychiatric:        Mood and Affect: Mood normal.        Behavior: Behavior normal.      Musculoskeletal Exam:  Beighton score for joint hypermobility: 5 Bilateral passive hyperextension of fingers, demonstrated by passive dorsiflexion of the fifth metacarpophalangeal joint to at least 90 degrees, hyperextension of left elbow to at least 10 degrees, hyperextension of bilateral knees to at least 10 degrees, flexion of  the spine with placement of bilateral hands (to level of flexor tendon sheath) on the floor without bending the knees.   CDAI Exam: CDAI Score: -- Patient Global: --; Provider Global: -- Swollen: 0 ; Tender: 0  Joint Exam 12/21/2023   All documented joints were normal     Investigation: No additional findings.  Imaging: No results found.  Recent Labs: Lab Results  Component Value Date   WBC 3.6 (L) 11/02/2023   HGB 11.0 (L) 11/02/2023   PLT 371.0 11/02/2023   NA 138 11/02/2023   K 4.1 11/02/2023   CL 103 11/02/2023   CO2 28 11/02/2023   GLUCOSE 121 (H) 11/02/2023   BUN 12  11/02/2023   CREATININE 0.49 11/02/2023   BILITOT 0.5 11/02/2023   ALKPHOS 85 11/02/2023   AST 16 11/02/2023   ALT 12 11/02/2023   PROT 7.7 11/02/2023   ALBUMIN 4.4 11/02/2023   CALCIUM 9.0 11/02/2023   GFRAA 140 02/08/2018   Lab Results  Component Value Date   ANA POSITIVE (A) 11/02/2023   RF <10 11/02/2023    Speciality Comments: No specialty comments available.  Procedures:  No procedures performed Allergies: Patient has no known allergies.   Assessment / Plan:     Visit Diagnoses: Positive ANA (antinuclear antibody) Patient with low titer positive ANA. At this time, she has no definitive inflammatory arthritis or other compelling features for clinically active systemic autoimmune disorder serving as a unifying diagnosis for patient's overall presentation.   As discussed with patient, a positive ANA need not represent presence of clinically active systemic autoimmune disease.  Can be positive in the normal population, autoimmune thyroid disease (Graves' disease, Hashimoto's thyroiditis etc.), or infection. ANA can be positive in the healthy population at the following rates: ANA 1:40: 20%-30%, ANA 1:80: 10%-15%, ANA 1:160: 5%, ANA 1:320: 3% positive, healthy relative of an SLE patient: 5%-25% positive (usually low titers), and elderly (age >70 years): up to 70% positive at ANA titer 1:40.    Given patient did have leukopenia on recent lab work, will obtain urinalysis, protein / creatinine ratio, urine, Anti-DNA antibody, double-stranded, C3 and C4, Anti-Smith antibody to rule out any possible underlying SLE. However, very low suspicion at this time, and as explained to patient, this would not explain her current manifestations.  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 Discussed with patient that this may be a contributing factor to her generalized joint pain. Discussed that we could consider PT that focuses on hypermobility, however she wishes to wait until she has returned to work. Will readdress at next appointment.  Anterior chest wall pain She also has evidence of mild anterior placement of distal right rib cage on exam, suspect rib flare but given tenderness on exam, will obtain CXR for evaluation.   Low back pain Patient with significant low back pain of unclear etiology. This appears to be her biggest complaint, and is very non-inflammatory in nature (no prolonged AM stiffness, worse with activity, better in the AM). She does have some spurring on XR image of lumbar spine obtained at orthopedic evaluation, but would not correlate with her level of symptomatic involvement. Advised patient to proceed with MRI lumbar spine for a better evaluation. Letter given to patient so she can return to work with mild restrictions in place.    Orders: Orders Placed This Encounter  Procedures   DG Chest 2 View   Urinalysis   Protein / creatinine ratio, urine   Anti-DNA antibody, double-stranded   C3 and C4   Anti-Smith antibody   No orders of the defined types were placed in this encounter.   I personally spent a total of 60 minutes in the care of the patient today including preparing to see the patient, getting/reviewing separately obtained history, performing a medically appropriate  exam/evaluation, counseling and educating, placing orders, and documenting clinical information in the EHR.  Follow-Up Instructions: Return in about 3 months (around 03/21/2024).   Asberry Claw, DO

## 2023-12-22 ENCOUNTER — Ambulatory Visit: Payer: Self-pay

## 2023-12-22 LAB — PROTEIN / CREATININE RATIO, URINE
Creatinine, Urine: 82 mg/dL (ref 20–275)
Protein/Creat Ratio: 85 mg/g{creat} (ref 24–184)
Protein/Creatinine Ratio: 0.085 mg/mg{creat} (ref 0.024–0.184)
Total Protein, Urine: 7 mg/dL (ref 5–24)

## 2023-12-22 LAB — URINALYSIS
Bilirubin Urine: NEGATIVE
Glucose, UA: NEGATIVE
Hgb urine dipstick: NEGATIVE
Ketones, ur: NEGATIVE
Leukocytes,Ua: NEGATIVE
Nitrite: NEGATIVE
Protein, ur: NEGATIVE
Specific Gravity, Urine: 1.019 (ref 1.001–1.035)
pH: 7 (ref 5.0–8.0)

## 2023-12-22 LAB — ANTI-DNA ANTIBODY, DOUBLE-STRANDED: ds DNA Ab: 17 [IU]/mL — ABNORMAL HIGH

## 2023-12-22 LAB — C3 AND C4
C3 Complement: 138 mg/dL (ref 83–193)
C4 Complement: 23 mg/dL (ref 15–57)

## 2023-12-22 LAB — ANTI-SMITH ANTIBODY: ENA SM Ab Ser-aCnc: <1 AI

## 2023-12-23 ENCOUNTER — Inpatient Hospital Stay

## 2023-12-23 ENCOUNTER — Inpatient Hospital Stay: Attending: Hematology and Oncology | Admitting: Hematology and Oncology

## 2023-12-23 VITALS — BP 103/71 | HR 64 | Temp 98.0°F | Resp 13 | Wt 148.3 lb

## 2023-12-23 DIAGNOSIS — D500 Iron deficiency anemia secondary to blood loss (chronic): Secondary | ICD-10-CM

## 2023-12-23 DIAGNOSIS — D509 Iron deficiency anemia, unspecified: Secondary | ICD-10-CM | POA: Insufficient documentation

## 2023-12-23 DIAGNOSIS — Z79899 Other long term (current) drug therapy: Secondary | ICD-10-CM | POA: Diagnosis not present

## 2023-12-23 DIAGNOSIS — R5383 Other fatigue: Secondary | ICD-10-CM | POA: Diagnosis not present

## 2023-12-23 DIAGNOSIS — N920 Excessive and frequent menstruation with regular cycle: Secondary | ICD-10-CM | POA: Insufficient documentation

## 2023-12-23 DIAGNOSIS — Z803 Family history of malignant neoplasm of breast: Secondary | ICD-10-CM | POA: Insufficient documentation

## 2023-12-23 LAB — RETIC PANEL
Immature Retic Fract: 10.1 % (ref 2.3–15.9)
RBC.: 5.21 MIL/uL — ABNORMAL HIGH (ref 3.87–5.11)
Retic Count, Absolute: 56.3 K/uL (ref 19.0–186.0)
Retic Ct Pct: 1.1 % (ref 0.4–3.1)
Reticulocyte Hemoglobin: 24.4 pg — ABNORMAL LOW (ref >27.9)

## 2023-12-23 LAB — CBC WITH DIFFERENTIAL (CANCER CENTER ONLY)
Abs Immature Granulocytes: 0.01 K/uL (ref 0.00–0.07)
Basophils Absolute: 0.1 K/uL (ref 0.0–0.1)
Basophils Relative: 1 %
Eosinophils Absolute: 0.1 K/uL (ref 0.0–0.5)
Eosinophils Relative: 1 %
HCT: 38.3 % (ref 36.0–46.0)
Hemoglobin: 12.1 g/dL (ref 12.0–15.0)
Immature Granulocytes: 0 %
Lymphocytes Relative: 36 %
Lymphs Abs: 1.8 K/uL (ref 0.7–4.0)
MCH: 23.7 pg — ABNORMAL LOW (ref 26.0–34.0)
MCHC: 31.6 g/dL (ref 30.0–36.0)
MCV: 75.1 fL — ABNORMAL LOW (ref 80.0–100.0)
Monocytes Absolute: 0.4 K/uL (ref 0.1–1.0)
Monocytes Relative: 8 %
Neutro Abs: 2.6 K/uL (ref 1.7–7.7)
Neutrophils Relative %: 54 %
Platelet Count: 277 K/uL (ref 150–400)
RBC: 5.1 MIL/uL (ref 3.87–5.11)
RDW: 19.6 % — ABNORMAL HIGH (ref 11.5–15.5)
WBC Count: 4.9 K/uL (ref 4.0–10.5)
nRBC: 0 % (ref 0.0–0.2)

## 2023-12-23 LAB — CMP (CANCER CENTER ONLY)
ALT: 16 U/L (ref 0–44)
AST: 21 U/L (ref 15–41)
Albumin: 4.1 g/dL (ref 3.5–5.0)
Alkaline Phosphatase: 79 U/L (ref 38–126)
Anion gap: 4 — ABNORMAL LOW (ref 5–15)
BUN: 11 mg/dL (ref 6–20)
CO2: 28 mmol/L (ref 22–32)
Calcium: 8.9 mg/dL (ref 8.9–10.3)
Chloride: 106 mmol/L (ref 98–111)
Creatinine: 0.44 mg/dL (ref 0.44–1.00)
GFR, Estimated: >60 mL/min (ref >60)
Glucose, Bld: 96 mg/dL (ref 70–99)
Potassium: 4.2 mmol/L (ref 3.5–5.1)
Sodium: 138 mmol/L (ref 135–145)
Total Bilirubin: 0.4 mg/dL (ref 0.0–1.2)
Total Protein: 7.9 g/dL (ref 6.5–8.1)

## 2023-12-23 LAB — IRON AND IRON BINDING CAPACITY (CC-WL,HP ONLY)
Iron: 26 ug/dL — ABNORMAL LOW (ref 28–170)
Saturation Ratios: 5 % — ABNORMAL LOW (ref 10.4–31.8)
TIBC: 507 ug/dL — ABNORMAL HIGH (ref 250–450)
UIBC: 481 ug/dL — ABNORMAL HIGH (ref 148–442)

## 2023-12-23 LAB — FERRITIN: Ferritin: 8 ng/mL — ABNORMAL LOW (ref 11–307)

## 2023-12-23 NOTE — Progress Notes (Signed)
 [facility] [phone]   [phone]  INITIAL CONSULT NOTE  Patient Care Team: Shepperson, Kirstin, PA-C as PCP - General (Family Medicine)  Hematological/Oncological History # Iron Deficiency Anemia 2/2 to GYN Bleeding  11/02/2023: White blood cell 3.6, hemoglobin 1.0, MCV 70.7, platelets 06/25/1969.  Ferritin 1, iron sat 4%, TIBC 493  12/23/2023: establish care with Dr. Federico   CHIEF COMPLAINTS/PURPOSE OF CONSULTATION:  Iron Deficiency Anemia    HISTORY OF PRESENTING ILLNESS:  Lori Vaughan 43 y.o. female with no significant past medical history who presents for evaluation of iron deficiency anemia.  On review of the previous records Lori Vaughan had labs collected on 11/02/2023 which showed White blood cell 3.6, hemoglobin 1.0, MCV 70.7, platelets 06/25/1969.  Ferritin 1, iron sat 4%, TIBC 493.   On exam today Lori Vaughan is accompanied by a hospital approved interpreter, though she does not require her for most of our conversation.  She reports that she has been having issues with low iron and has been issue before in the past.  She reports that her primary symptom is fatigue.  She notes that her menstrual cycles tend to last for about 3 days and on the second day it is the heaviest.  She uses tampons and reports that they are soaked.  She notes that sometimes a tampon can last about 3 hours on her heaviest day.  She is not having any bleeding anywhere else such as nosebleeds, gum bleeding, or dark stools.  She reports that she does not have any special diets but does not enjoy beings.  She eats red meat approximately once per week but prefers chicken.  She is not currently on iron pills.  On further discussion she reports her family history is remarkable for a maternal great aunt who had breast cancer.  She reports that her mother 28 years old and healthy and her father died during a pancreatic surgery.  She has 4 healthy children.  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  She notes that she is not having any lightheadedness, dizziness, or shortness of breath.  She denies any ice cravings or headaches.  She does get some tension in her neck.  Otherwise she feels well and denies any fevers, chills, sweats, nausea, vomiting or diarrhea.  A full 10 point ROS otherwise negative.  MEDICAL HISTORY:  Past Medical History:  Diagnosis Date   Abnormal Pap smear 08/2014   LGSIL, 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  Not on meds now.    SURGICAL HISTORY: Past Surgical History:  Procedure Laterality Date   CESAREAN SECTION  1998, 1999, 2005, 2012   x4   TUBAL LIGATION     2012    SOCIAL HISTORY: Social History   Socioeconomic History   Marital status: Married    Spouse name: Donley   Number of children: 4   Years of education: 6   Highest education level: Not on file  Occupational History   Occupation: Housewife  Tobacco Use   Smoking status: Never    Passive exposure: Never   Smokeless tobacco: Never  Vaping Use   Vaping status: Never Used  Substance and Sexual Activity   Alcohol use: No   Drug use: No  Sexual activity: Yes    Birth control/protection: Surgical  Other Topics Concern   Not on file  Social History Narrative   Originally from [place]   Came to [employer]. In 2001   Lives at home with husband and 4 children   Social Drivers of Health   Financial Resource Strain: Low Risk  (11/22/2023)   Received from [facility]   Overall Financial Resource Strain (CARDIA)    How hard is it for you to pay for the very basics like food, housing, medical care, and heating?: Not very hard  Food Insecurity: No Food Insecurity (12/23/2023)   Hunger Vital Sign    Worried About  Running Out of Food in the Last Year: Never true    Ran Out of Food in the Last Year: Never true  Transportation Needs: No Transportation Needs (12/23/2023)   PRAPARE - Administrator, Civil Service (Medical): No    Lack of Transportation (Non-Medical): No  Physical Activity: Inactive (11/02/2023)   Exercise Vital Sign    Days of Exercise per Week: 0 days    Minutes of Exercise per Session: 0 min  Stress: Stress Concern Present (11/22/2023)   Received from [facility] - Occupational Stress Questionnaire    Do you feel stress - tense, restless, nervous, or anxious, or unable to sleep at night because your mind is troubled all the time - these days?: Very much  Social Connections: Socially Integrated (11/22/2023)   Received from [facility]   Social Network    How would you rate your social network (family, work, friends)?: Good participation with social networks  Recent Concern: Social Connections - Moderately Isolated (11/02/2023)   Social Connection and Isolation Panel    Frequency of Communication with Friends and Family: More than three times a week    Frequency of Social Gatherings with Friends and Family: Once a week    Attends Religious Services: Never    Database administrator or Organizations: No    Attends Banker Meetings: Never    Marital Status: Married  Catering manager Violence: Not At Risk (12/23/2023)   Humiliation, Afraid, Rape, and Kick questionnaire    Fear of Current or Ex-Partner: No    Emotionally Abused: No    Physically Abused: No    Sexually Abused: No    FAMILY HISTORY: Family History  Problem Relation Age of Onset   Breast cancer Maternal Aunt        not sure of age at time of diagnosis   Breast cancer Maternal Aunt        not sure of age at diagnosis   Alcohol abuse Father    Pancreatitis Father        alcoholic--cause of death   Depression Brother        anxiety and depression, drug  abuse   Drug abuse Brother    Autism Son     ALLERGIES:  has no known allergies.  MEDICATIONS:  Current Outpatient Medications  Medication Sig Dispense Refill   Ascorbic Acid (VITAMIN C PO) Take by mouth.     ASHWAGANDHA PO Take by mouth.     Collagen-Vitamin C-Biotin (COLLAGEN PO) Take by mouth.     ibuprofen  (ADVIL ,MOTRIN ) 800 MG tablet Take 1 tablet (800 mg total) by mouth every 8 (eight) hours as needed. 60 tablet 1   MAGNESIUM PO Take by mouth.     Vitamin D-Vitamin K (VITAMIN K2-VITAMIN D3  PO) Take by mouth.     No current facility-administered medications for this visit.    REVIEW OF SYSTEMS:   Constitutional: ( - ) fevers, ( - )  chills , ( - ) night sweats Eyes: ( - ) blurriness of vision, ( - ) double vision, ( - ) watery eyes Ears, nose, mouth, throat, and face: ( - ) mucositis, ( - ) sore throat Respiratory: ( - ) cough, ( - ) dyspnea, ( - ) wheezes Cardiovascular: ( - ) palpitation, ( - ) chest discomfort, ( - ) lower extremity swelling Gastrointestinal:  ( - ) nausea, ( - ) heartburn, ( - ) change in bowel habits Skin: ( - ) abnormal skin rashes Lymphatics: ( - ) new lymphadenopathy, ( - ) easy bruising Neurological: ( - ) numbness, ( - ) tingling, ( - ) new weaknesses Behavioral/Psych: ( - ) mood change, ( - ) new changes  All other systems were reviewed with the patient and are negative.  PHYSICAL EXAMINATION:  Vitals:   12/23/23 0900  BP: 103/71  Pulse: 64  Resp: 13  Temp: 98 F (36.7 C)  SpO2: 99%   Filed Weights   12/23/23 0900  Weight: 148 lb 4.8 oz (67.3 kg)    GENERAL: well appearing middle-age Hispanic female in NAD  SKIN: skin color, texture, turgor are normal, no rashes or significant lesions EYES: conjunctiva are pink and non-injected, sclera clear LUNGS: clear to auscultation and percussion with normal breathing effort HEART: regular rate & rhythm and no murmurs and no lower extremity edema Musculoskeletal: no cyanosis of digits and no  clubbing  PSYCH: alert & oriented x 3, fluent speech NEURO: no focal motor/sensory deficits  LABORATORY DATA:  I have reviewed the data as listed    Latest Ref Rng & Units 11/02/2023    2:46 PM 02/08/2018    2:54 PM 01/20/2016   10:35 AM  CBC  WBC 4.0 - 10.5 K/uL 3.6  5.7  5.0   Hemoglobin 12.0 - 15.0 g/dL 88.9  9.8  89.7   Hematocrit 36.0 - 46.0 % 35.1  32.3  33.6   Platelets 150.0 - 400.0 K/uL 371.0  372  339        Latest Ref Rng & Units 11/02/2023    2:46 PM 02/08/2018    2:54 PM 01/20/2016   10:35 AM  CMP  Glucose 70 - 99 mg/dL 878  879  94   BUN 6 - 23 mg/dL 12  15  8    Creatinine 0.40 - 1.20 mg/dL 9.50  9.44  9.44   Sodium 135 - 145 mEq/L 138  139  139   Potassium 3.5 - 5.1 mEq/L 4.1  3.6  4.9   Chloride 96 - 112 mEq/L 103  103  102   CO2 19 - 32 mEq/L 28  23  23    Calcium 8.4 - 10.5 mg/dL 9.0  8.8  9.1   Total Protein 6.0 - 8.3 g/dL 7.7  7.4  7.9   Total Bilirubin 0.2 - 1.2 mg/dL 0.5  <9.7  0.5   Alkaline Phos 39 - 117 U/L 85  121  93   AST 0 - 37 U/L 16  18  21    ALT 0 - 35 U/L 12  17  13       ASSESSMENT & PLAN Lori Vaughan 43 y.o. female with no significant past medical history who presents for evaluation of iron deficiency anemia.  After review of the labs,  review of the records, and discussion with the patient the patients findings are most consistent with iron deficiency anemia in the setting of GYN bleeding.  # Iron Deficiency Anemia 2/2 to GYN Bleeding -- Findings are consistent with iron deficiency anemia secondary to patient's menorrhagia --Encouraged her to follow-up with OB/GYN for better control of her menstrual cycles --We will confirm iron deficiency anemia by ordering iron panel and ferritin as well as reticulocytes, CBC, and CMP -- Recommend starting ferrous sulfate  325 mg daily with a source of vitamin C.  Also noted the patient that she could try Floradix if p.o. ferrous sulfate  therapy is too difficult on her stomach. --We will plan to  proceed with IV iron therapy in order to help bolster the patient's blood counts if p.o. iron therapy is not adequate. --Plan for return to clinic in 3 months with labs 1 week before    Orders Placed This Encounter  Procedures   CBC with Differential (Cancer Center Only)    Standing Status:   Future    Number of Occurrences:   1    Expiration Date:   12/22/2024   CMP (Cancer Center only)    Standing Status:   Future    Number of Occurrences:   1    Expiration Date:   12/22/2024   Ferritin    Standing Status:   Future    Number of Occurrences:   1    Expiration Date:   12/22/2024   Iron and Iron Binding Capacity ([facility] only)    Standing Status:   Future    Number of Occurrences:   1    Expiration Date:   12/22/2024   Retic Panel    Standing Status:   Future    Number of Occurrences:   1    Expiration Date:   12/22/2024    All questions were answered. The patient knows to call the clinic with any problems, questions or concerns.  A total of more than 60 minutes were spent on this encounter with face-to-face time and non-face-to-face time, including preparing to see the patient, ordering tests and/or medications, counseling the patient and coordination of care as outlined above.   Lori IVAR Kidney, MD Department of Hematology/Oncology [facility] Phone: [phone] Pager: [phone] Email: [email or web address]  12/23/2023 9:49 AM

## 2023-12-29 ENCOUNTER — Ambulatory Visit

## 2023-12-29 VITALS — BP 105/61 | HR 72 | Resp 14 | Ht 64.0 in | Wt 148.2 lb

## 2023-12-29 DIAGNOSIS — R768 Other specified abnormal immunological findings in serum: Secondary | ICD-10-CM

## 2023-12-29 DIAGNOSIS — M545 Low back pain, unspecified: Secondary | ICD-10-CM

## 2023-12-29 NOTE — Progress Notes (Signed)
 Office Visit Note  Patient: Lori Vaughan             Date of Birth: 1981/03/16           MRN: [id number]             PCP: Donata Snowman, PA-C Referring: Donata Snowman, PA* Visit Date: 12/29/2023  Subjective:  Follow-up  History of Present Illness: Lori Vaughan is a 43 y.o. female who is presenting for follow-up to discuss lab results. She was last seen on 12/21/23 as a new patient for a positive ANA.   At her initial visit, patient with low titer positive ANA, however given new onset leukopenia, I did initiate full work-up for possible SLE. Her dsDNA resulted positive at 17. However, remainder of work-up was negative.   Patient denies any oral ulcers, alopecia, rashes, photosensitivity, raynaud's symptoms, dry eyes, dry mouth, fevers, lymphadenopathy, foamy urine.   She continues to admit to low back pain and bilateral shoulder pain, unchanged from her prior visit. Her MRI lumbar spine is scheduled at this time.   Activities of Daily Living:  Patient reports morning stiffness for 1 minute.   Patient Reports nocturnal pain.  Difficulty dressing/grooming: Denies Difficulty climbing stairs: Reports Difficulty getting out of chair: Reports Difficulty using hands for taps, buttons, cutlery, and/or writing: Denies  Review of Systems  Constitutional:  Positive for fatigue.  HENT:  Negative for mouth sores and mouth dryness.   Eyes:  Negative for dryness.  Respiratory:  Negative for shortness of breath.   Cardiovascular:  Negative for chest pain and palpitations.  Gastrointestinal:  Negative for blood in stool, constipation and diarrhea.  Endocrine: Negative for increased urination.  Genitourinary:  Negative for involuntary urination.  Musculoskeletal:  Positive for joint pain, joint pain, myalgias, morning stiffness and myalgias. Negative for gait problem, joint swelling, muscle weakness and muscle tenderness.  Skin:  Negative for color change, rash,  hair loss and sensitivity to sunlight.  Allergic/Immunologic: Positive for susceptible to infections.  Neurological:  Positive for headaches. Negative for dizziness.  Hematological:  Negative for swollen glands.  Psychiatric/Behavioral:  Positive for depressed mood and sleep disturbance. The patient is nervous/anxious.     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  Not on meds now.    Family History  Problem Relation Age of Onset   Breast cancer Maternal Aunt        not sure of age at time of diagnosis   Breast cancer Maternal Aunt        not sure of age at diagnosis   Alcohol abuse Father    Pancreatitis Father        alcoholic--cause of death   Depression Brother        anxiety and depression, drug abuse   Drug abuse Brother    Autism Son    Past Surgical History:  Procedure Laterality Date   CESAREAN SECTION  1998, 1999, 2005, 2012  x4   TUBAL LIGATION     2012   Social History   Tobacco Use   Smoking status: Never    Passive exposure: Never   Smokeless tobacco: Never  Vaping Use   Vaping status: Never Used  Substance Use Topics   Alcohol use: No   Drug use: No   Social History   Social History Narrative   Originally from [place]   Came to [employer]. In 2001   Lives at home with husband and 4 children      There is no immunization history on file for this patient.   Objective: Vital Signs: BP 105/61 (BP Location: Left Arm, Patient Position:  Sitting, Cuff Size: Normal)   Pulse 72   Resp 14   Ht 5' 4 (1.626 m)   Wt 148 lb 3.2 oz (67.2 kg)   LMP 12/28/2023   BMI 25.44 kg/m    Physical Exam Vitals and nursing note reviewed.  HENT:     Head: Normocephalic and atraumatic.     Nose: Nose normal.  Eyes:     Conjunctiva/sclera: Conjunctivae normal.     Pupils: Pupils are equal, round, and reactive to light.  Cardiovascular:     Rate and Rhythm: Normal rate and regular rhythm.     Heart sounds: Normal heart sounds.  Pulmonary:     Effort: Pulmonary effort is normal.     Breath sounds: Normal breath sounds.  Skin:    General: Skin is warm and dry.  Neurological:     Mental Status: She is alert. Mental status is at baseline.  Psychiatric:        Mood and Affect: Mood normal.        Behavior: Behavior normal.      Musculoskeletal Exam:   CDAI Exam: CDAI Score: -- Patient Global: --; Provider Global: -- Swollen: 0 ; Tender: 0  Joint Exam 12/29/2023   All documented joints were normal     Investigation: No additional findings.  Imaging: No results found.  Recent Labs: Lab Results  Component Value Date   WBC 4.9 12/23/2023   HGB 12.1 12/23/2023   PLT 277 12/23/2023   NA 138 12/23/2023   K 4.2 12/23/2023   CL 106 12/23/2023   CO2 28 12/23/2023   GLUCOSE 96 12/23/2023   BUN 11 12/23/2023   CREATININE 0.44 12/23/2023   BILITOT 0.4 12/23/2023   ALKPHOS 79 12/23/2023   AST 21 12/23/2023   ALT 16 12/23/2023   PROT 7.9 12/23/2023   ALBUMIN 4.1 12/23/2023   CALCIUM 8.9 12/23/2023   GFRAA 140 02/08/2018    Speciality Comments: No specialty comments available.  Procedures:  No procedures performed Allergies: Patient has no known allergies.   Assessment / Plan:     Visit Diagnoses:  Positive ANA (antinuclear antibody) Positive dsDNA Leukopenia (resolved) Patient with low titer positive ANA w/ positive dsDNA. Initially was concerned for possible underlying SLE given new onset leukopenia, however  leukopenia resolved on repeat testing, lowering my suspicion. As discussed with patient, given lack of any systemic symptoms of SLE, she currently does not meet clinical criteria for SLE based on 2019 ACR criteria (current score 6; requires >10 for dx). Discussed symptoms with patient that could suggest her disease is progressing, and patient educated to alert us  immediately if she develops any of the aforementioned symptoms. Will continue to monitor patiently closely. Will repeat U/A, UPC, C3, C4, dsDNA, and CBC at next follow-up  Low back pain Patient with persistent  low back pain, as discussed at prior visit. Advised patient to proceed with MRI as also discussed at her last visit for further evaluation.    Orders: No orders of the defined types were placed in this encounter.  No orders of the defined types were placed in this encounter.  I personally spent a total of 20 minutes in the care of the patient today including preparing to see the patient, getting/reviewing separately obtained history, performing a medically appropriate exam/evaluation, counseling and educating, documenting clinical information in the EHR, and independently interpreting results.   Follow-Up Instructions: No follow-ups on file.   Asberry Claw, DO  Note - This record has been created using Animal nutritionist.  Chart creation errors have been sought, but may not always  have been located. Such creation errors do not reflect on  the standard of medical care.

## 2023-12-30 ENCOUNTER — Encounter: Payer: Self-pay | Admitting: Physical Medicine and Rehabilitation

## 2023-12-30 ENCOUNTER — Telehealth: Payer: Self-pay

## 2023-12-30 ENCOUNTER — Ambulatory Visit
Admission: RE | Admit: 2023-12-30 | Discharge: 2023-12-30 | Disposition: A | Discharge status: Home / Self Care | Source: Ambulatory Visit | Attending: Physical Medicine and Rehabilitation | Admitting: Physical Medicine and Rehabilitation

## 2023-12-30 DIAGNOSIS — M7918 Myalgia, other site: Secondary | ICD-10-CM

## 2023-12-30 DIAGNOSIS — G8929 Other chronic pain: Secondary | ICD-10-CM

## 2023-12-30 DIAGNOSIS — M5416 Radiculopathy, lumbar region: Secondary | ICD-10-CM

## 2023-12-30 DIAGNOSIS — M25551 Pain in right hip: Secondary | ICD-10-CM

## 2023-12-30 DIAGNOSIS — M5127 Other intervertebral disc displacement, lumbosacral region: Secondary | ICD-10-CM | POA: Diagnosis not present

## 2023-12-30 DIAGNOSIS — M47816 Spondylosis without myelopathy or radiculopathy, lumbar region: Secondary | ICD-10-CM | POA: Diagnosis not present

## 2023-12-30 NOTE — Telephone Encounter (Signed)
 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

## 2024-01-02 ENCOUNTER — Other Ambulatory Visit: Payer: Self-pay | Admitting: Hematology and Oncology

## 2024-01-06 ENCOUNTER — Telehealth: Payer: Self-pay

## 2024-01-06 ENCOUNTER — Other Ambulatory Visit (HOSPITAL_COMMUNITY): Payer: Self-pay | Admitting: Pharmacy Technician

## 2024-01-06 NOTE — Telephone Encounter (Signed)
 Dr. Federico, patient will be scheduled as soon as possible.  Auth Submission: APPROVED Site of care: Site of care: [facility] Payer: [employer] commercial Medication & CPT/J Code(s) submitted: Feraheme (ferumoxytol) [id number] Diagnosis Code:  Route of submission (phone, fax, portal): phone Microbiologist Rx Phone # Fax # Auth type: Buy/Bill PB Units/visits requested: 510mg  x 2 doses Reference number: [id number] Approval from: 01/04/24 to 07/03/24

## 2024-01-17 ENCOUNTER — Ambulatory Visit

## 2024-01-17 VITALS — BP 99/63 | HR 60 | Temp 98.5°F | Resp 18 | Ht 65.0 in | Wt 147.2 lb

## 2024-01-17 DIAGNOSIS — D500 Iron deficiency anemia secondary to blood loss (chronic): Secondary | ICD-10-CM | POA: Diagnosis not present

## 2024-01-17 DIAGNOSIS — N920 Excessive and frequent menstruation with regular cycle: Secondary | ICD-10-CM | POA: Diagnosis not present

## 2024-01-17 MED ORDER — SODIUM CHLORIDE 0.9 % IV SOLN
510.0000 mg | Freq: Once | INTRAVENOUS | Status: AC
  Administered 2024-01-17: 510 mg via INTRAVENOUS
  Filled 2024-01-17: qty 17

## 2024-01-17 NOTE — Patient Instructions (Signed)

## 2024-01-17 NOTE — Progress Notes (Signed)
 Diagnosis: Iron Deficiency Anemia  Provider:  Praveen Mannam MD  Procedure: IV Infusion  IV Type: Peripheral, IV Location: L Antecubital  Feraheme (Ferumoxytol), Dose: 510 mg  Infusion Start Time: 1108  Infusion Stop Time: 1127  Post Infusion IV Care: Observation period completed and Peripheral IV Discontinued  Discharge: Condition: Good, Destination: Home . AVS Provided  Performed by:  Maximiano JONELLE Pouch, LPN

## 2024-01-20 ENCOUNTER — Encounter: Payer: Self-pay | Admitting: Hematology and Oncology

## 2024-01-20 ENCOUNTER — Ambulatory Visit (INDEPENDENT_AMBULATORY_CARE_PROVIDER_SITE_OTHER): Admitting: Physical Medicine and Rehabilitation

## 2024-01-20 ENCOUNTER — Other Ambulatory Visit: Payer: Self-pay

## 2024-01-20 ENCOUNTER — Encounter: Payer: Self-pay | Admitting: Physical Medicine and Rehabilitation

## 2024-01-20 DIAGNOSIS — M546 Pain in thoracic spine: Secondary | ICD-10-CM | POA: Diagnosis not present

## 2024-01-20 DIAGNOSIS — G8929 Other chronic pain: Secondary | ICD-10-CM | POA: Diagnosis not present

## 2024-01-20 DIAGNOSIS — M5416 Radiculopathy, lumbar region: Secondary | ICD-10-CM | POA: Diagnosis not present

## 2024-01-20 DIAGNOSIS — M7918 Myalgia, other site: Secondary | ICD-10-CM

## 2024-01-20 MED ORDER — DULOXETINE HCL 30 MG PO CPEP
ORAL_CAPSULE | ORAL | 3 refills | Status: AC
  Filled 2024-01-20: qty 60, 30d supply, fill #0

## 2024-01-20 NOTE — Progress Notes (Signed)
 Lori Vaughan - 43 y.o. female MRN [id number]  Date of birth: [birth year]  Office Visit Note: Visit Date: 01/20/2024 PCP: Donata Snowman, PA-C Referred by: Shepperson, Kirstin, [place]*  Subjective: Chief Complaint  Patient presents with   Lower Back - Pain   HPI: Lori Vaughan is a 43 y.o. female who comes in today for evaluation of chronic, worsening and severe bilateral thoracic back pain radiating to lower back, right hip/groin and knee. Lower back pain radiating down right leg is biggest pain generator for her. Spanish interpreter at bedside. Pain ongoing for several years, worsens with movement and activity. 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 She describes her pain as sharp, stabbing and burning sensation, currently rates as 5 out of 10. Some relief of pain with home exercise regimen, rest and use of medications. History of chiropractic treatments that increased her pain. Patient states she did receive call to start physical therapy, however she decided to hold at that time. Recent lumbar MRI imaging shows degenerative disc bulging with facet hypertrophy at L4-L5 with resultant mild left greater than right lateral recess stenosis. There is advanced degenerative disc space narrowing with reactive endplate change at L5-S1 without significant stenosis or impingement. No high grade spinal canal stenosis. Patient is requesting note for work today specifying continued restrictions. Patient denies focal weakness, numbness and tingling. No recent trauma or falls.       Review of Systems  Musculoskeletal:  Positive for back pain and myalgias.  Neurological:  Negative for tingling, sensory change, focal weakness and weakness.  All other systems reviewed and are negative.  Otherwise per HPI.  Assessment & Plan: Visit Diagnoses:    ICD-10-CM   1. Chronic bilateral thoracic back pain  M54.6    G89.29     2. Lumbar radiculopathy  M54.16      3. Myofascial pain syndrome  M79.18        Plan: Findings:  1. Chronic, worsening and severe bilateral lower back pain radiating to right hip and down leg.  Patient continues to have severe pain despite good conservative therapy such as chiropractic treatments, home exercise regimen, rest and use of medications I discussed recent lumbar MRI with her today using imaging and spine model.  There are multilevel degenerative changes.  Facet arthropathy at L4-L5.  No severe nerve impingement noted.  No high-grade central canal stenosis noted.  We discussed treatment plan in detail today.  I would like for patient to attend short course of formal physical therapy.  We also discussed possibility of performing lumbar epidural steroid injection. Would recommend trying right L5-S1 interlaminar epidural steroid injection. She would like to try PT before injection therapy. I also discussed medication management and would like to trial her on Cymbalta.   2. Chronic bilateral thoracic back pain. There is a considerable amount of myofascial tenderness noted to bilateral thoracic and lumbar paraspinal regions upon exam today. I am concerned there could be an underlying chronic pain/central sensitization disorder working to exacerbate her symptoms.  I would like her to regroup with formal physical therapy and also see how the Cymbalta works for the next several weeks.  No red flag symptoms noted upon exam today.    Meds & Orders:  Meds ordered this encounter  Medications   DULoxetine (CYMBALTA) 30 MG capsule    Sig: Take 1 capsule (30 mg total) once a day by mouth for 2 weeks, then take 1 capsule (30 mg) twice a  day.    Dispense:  60 capsule    Refill:  3   No orders of the defined types were placed in this encounter.   Follow-up: Return if symptoms worsen or fail to improve.   Procedures: No procedures performed      Clinical History: CLINICAL DATA:  Initial evaluation for lower back pain with radiation  into both legs.   EXAM: MRI LUMBAR SPINE WITHOUT CONTRAST   TECHNIQUE: Multiplanar, multisequence MR imaging of the lumbar spine was performed. No intravenous contrast was administered.   COMPARISON:  Radiograph from 11/02/2023.   FINDINGS: Segmentation: Standard. Lowest well-formed disc space labeled the L5-S1 level.   Alignment: Straightening with mild reversal of the normal lumbar lordosis. No significant listhesis.   Vertebrae: Vertebral body height maintained without acute or chronic fracture. Bone marrow signal intensity mildly heterogeneous but overall within normal limits. No worrisome osseous lesions. Prominent degenerative reactive endplate change present about the L5-S1 interspace. No other abnormal marrow edema.   Conus medullaris and cauda equina: Conus extends to the T12 level. Conus and cauda equina appear normal.   Paraspinal and other soft tissues: Unremarkable.   Disc levels:   L1-2: Degenerative intervertebral disc space narrowing with disc desiccation and diffuse disc bulge. Central annular fissure. No spinal stenosis. Foramina remain patent.   L2-3: Disc desiccation with mild disc bulge. Central annular fissure. No canal or foraminal stenosis.   L3-4: Disc desiccation with mild disc bulge. Central annular fissure. Mild facet hypertrophy. No spinal stenosis. Foramina remain patent.   L4-5: Disc desiccation with mild disc bulge, slightly asymmetric to the left. Associated central to left foraminal annular fissuring. Mild bilateral facet hypertrophy. Resultant mild left greater than right lateral recess stenosis. Central canal remains patent. No significant foraminal stenosis.   L5-S1: Degenerative intervertebral disc space narrowing with disc desiccation and diffuse disc bulge. Prominent reactive endplate change with marginal endplate osteophytic spurring. No significant spinal stenosis. Foramina remain patent.   IMPRESSION: 1. Degenerative  disc bulging with facet hypertrophy at L4-5 with resultant mild left greater than right lateral recess stenosis. 2. Advanced degenerative disc space narrowing with reactive endplate change at L5-S1 without significant stenosis or impingement. 3. Additional mild noncompressive disc bulging at L1-2 through L3-4 without significant stenosis.     Electronically Signed   By: Morene Hoard M.D.   On: 01/02/2024 01:50   She reports that she has never smoked. She has never been exposed to tobacco smoke. She has never used smokeless tobacco.  Recent Labs    11/02/23 1446  HGBA1C 5.8    Objective:  VS:  HT:    WT:   BMI:     BP:   HR: bpm  TEMP: ( )  RESP:  Physical Exam Vitals and nursing note reviewed.  HENT:     Head: Normocephalic and atraumatic.     Right Ear: External ear normal.     Left Ear: External ear normal.     Nose: Nose normal.     Mouth/Throat:     Mouth: Mucous membranes are moist.  Eyes:     Extraocular Movements: Extraocular movements intact.  Cardiovascular:     Rate and Rhythm: Normal rate.     Pulses: Normal pulses.  Pulmonary:     Effort: Pulmonary effort is normal.  Abdominal:     General: Abdomen is flat. There is no distension.  Musculoskeletal:        General: Tenderness present.     Cervical back: Normal  range of motion.     Comments: Patient rises from seated position to standing without difficulty. Good lumbar range of motion. No pain noted with facet loading. 5/5 strength noted with bilateral hip flexion, knee flexion/extension, ankle dorsiflexion/plantarflexion and EHL. No clonus noted bilaterally. No pain upon palpation of greater trochanters. No pain with internal/external rotation of bilateral hips. Sensation intact bilaterally. Myofascial tenderness noted to bilateral thoracic and lumbar paraspinal regions upon palpation. Negative slump test bilaterally. Ambulates without aid, gait steady.     Skin:    General: Skin is warm and dry.      Capillary Refill: Capillary refill takes less than 2 seconds.  Neurological:     General: No focal deficit present.     Mental Status: She is alert and oriented to person, place, and time.  Psychiatric:        Mood and Affect: Mood normal.        Behavior: Behavior normal.     Ortho Exam  Imaging: No results found.  Past Medical/Family/Surgical/Social History: Medications & Allergies reviewed per EMR, new medications updated. 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  Not on meds now.   Family History  Problem Relation Age of Onset   Breast cancer Maternal Aunt        not sure of age at time of diagnosis   Breast cancer Maternal Aunt        not sure of age at diagnosis   Alcohol abuse Father    Pancreatitis Father        alcoholic--cause of death   Depression Brother        anxiety and depression, drug abuse   Drug abuse Brother    Autism Son    Past Surgical History:  Procedure Laterality Date   CESAREAN SECTION  1998, 1999, 2005, 2012   x4   TUBAL LIGATION     2012   Social History   Occupational History   Occupation: Housewife  Tobacco Use   Smoking status: Never    Passive exposure: Never   Smokeless tobacco: Never  Vaping Use   Vaping status: Never Used  Substance and Sexual Activity   Alcohol use: No   Drug use: No   Sexual activity: Yes    Birth control/protection: Surgical

## 2024-01-20 NOTE — Progress Notes (Signed)
 Pain Scale   Average Pain 3 Patient advising she has back pain and she is her for MRI review.        +Driver, -BT, -Dye Allergies.

## 2024-01-24 ENCOUNTER — Ambulatory Visit: Admitting: *Deleted

## 2024-01-24 VITALS — BP 94/57 | HR 64 | Temp 98.6°F | Resp 18 | Ht 64.0 in | Wt 146.0 lb

## 2024-01-24 DIAGNOSIS — N920 Excessive and frequent menstruation with regular cycle: Secondary | ICD-10-CM

## 2024-01-24 DIAGNOSIS — D500 Iron deficiency anemia secondary to blood loss (chronic): Secondary | ICD-10-CM

## 2024-01-24 MED ORDER — SODIUM CHLORIDE 0.9 % IV SOLN
510.0000 mg | Freq: Once | INTRAVENOUS | Status: AC
  Administered 2024-01-24: 510 mg via INTRAVENOUS
  Filled 2024-01-24: qty 17

## 2024-01-24 NOTE — Progress Notes (Signed)
 Diagnosis: Iron Deficiency Anemia  Provider:  Mannam, Praveen MD  Procedure: IV Infusion  IV Type: Peripheral, IV Location: L Antecubital  Feraheme (Ferumoxytol), Dose: 510 mg  Infusion Start Time: 1223  Infusion Stop Time: 1241  Post Infusion IV Care: Observation period completed  Discharge: Condition: Good, Destination: Home . AVS Declined  Performed by:  Mathew Therisa NOVAK, RN

## 2024-02-08 NOTE — Therapy (Signed)
 OUTPATIENT PHYSICAL THERAPY THORACOLUMBAR EVALUATION   Patient Name: Vernie Vinciguerra MRN: [id number] DOB:1981/02/18, 43 y.o., female Today's Date: 02/09/2024  END OF SESSION:  PT End of Session - 02/09/24 0802     Visit Number 1    Number of Visits 16    Date for Recertification  04/05/24    Authorization Type BCBS 20% COINSURANCE, 30 VL, NO AUTH NEEDED    Progress Note Due on Visit 10    PT Start Time 0803    PT Stop Time 0845    PT Time Calculation (min) 42 min    Activity Tolerance Patient tolerated treatment well    Behavior During Therapy [facility] for tasks assessed/performed          Past Medical History:  Diagnosis Date   Abnormal Pap smear 08/2014   LGSIL, 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  Not on meds now.   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   Supervision of normal pregnancy 07/02/2010    PCP: Robbi Brinks, PA-C  REFERRING PROVIDER: Duwaine FORBES Pouch, NP  REFERRING DIAG: [phone] (ICD-10-CM) - Chronic bilateral thoracic back pain M54.16 (ICD-10-CM) - Lumbar radiculopathy M25.551 (ICD-10-CM) - Pain in right hip M25.561,G89.29 (ICD-10-CM) - Chronic pain of right knee M79.18 (ICD-10-CM) - Myofascial pain syndrome  Rationale for Evaluation and Treatment: Rehabilitation  THERAPY DIAG:  Pain in thoracic spine  Chronic right shoulder pain  Pain in right hip  Chronic pain of right knee  Muscle weakness (generalized)  Other symptoms and signs involving the  musculoskeletal system  Abnormal posture  ONSET DATE: April 2025  SUBJECTIVE:                                                                                                                                                                                           SUBJECTIVE STATEMENT: I've been through a lot of stuff this year.  PERTINENT HISTORY:  Patient reports that she is having high levels of pain in thoracic musculature that begins 15-20 minutes after beginning work/standing. Patient also reports low back pain, Rt shoulder pain, Rt hip pain, and Rt knee  pain, with intermittent pain in xyphoid process with palpation. Patient has not had any trauma, injuries, or surgeries. Patient notes that she was given an anti-depressant that was supposed to assist with pain, but caused dizziness, n/v, sweating, and gastrointestinal distress so she has discontinued the medication.   See PMH or personal factors for in depth comorbidities   PAIN:  Are you having pain? Yes: NPRS scale: soreness and stiff at rest, 10/10 at max  Pain location: mainly the thoracic spine Pain description: sharp, stabbing, burning  Aggravating factors: turning, bending, lifting, standing for a long time  Relieving factors: tylenol, stretching,   PRECAUTIONS: Other: given precautions for standing/rest at work   RED FLAGS: Bowel or bladder incontinence: No and Cauda equina syndrome: No   WEIGHT BEARING RESTRICTIONS: No  FALLS:  Has patient fallen in last 6 months? No  LIVING ENVIRONMENT: Lives with: lives with their family Lives in: House/apartment Stairs: No Has following equipment at home: None  OCCUPATION: works in a lab  PLOF: Independent  PATIENT GOALS: to make pain go away   NEXT MD VISIT: not currently scheduled   OBJECTIVE:  Note: Objective measures were completed at Evaluation unless otherwise noted.  DIAGNOSTIC FINDINGS:  IMPRESSION: 1. Degenerative disc bulging with facet hypertrophy at  L4-5 with resultant mild left greater than right lateral recess stenosis. 2. Advanced degenerative disc space narrowing with reactive endplate change at L5-S1 without significant stenosis or impingement. 3. Additional mild noncompressive disc bulging at L1-2 through L3-4 without significant stenosis.  PATIENT SURVEYS:  PSFS: THE PATIENT SPECIFIC FUNCTIONAL SCALE  Place score of 0-10 (0 = unable to perform activity and 10 = able to perform activity at the same level as before injury or problem)  Activity Date: 02/09/2024    Standing for a long time   0    2. Bending   5    3. Twisting   5    4.      Total Score 3.33      Total Score = Sum of activity scores/number of activities  Minimally Detectable Change: 3 points (for single activity); 2 points (for average score)  [facility] (nd). The Patient Specific Functional Scale . Retrieved from [email or web address]   COGNITION: Overall cognitive status: Within functional limits for tasks assessed     SENSATION: Not tested  MUSCLE LENGTH:   POSTURE: rounded shoulders, forward head, and increased thoracic kyphosis  PALPATION: TTP in Rt hip, Rt SIJ; not TTP to thoracic spine or lumbar spine; TTP in xyphoid process   LUMBAR ROM:   AROM Eval 02/09/2024  Flexion WFL  Extension Highly limited   Right lateral flexion WFL  Left lateral flexion WFL  Right rotation WFL  Left rotation WFL   (Blank rows = not tested)   Thoracic ROM: 02/09/2024 Flexion: limited to ~25-50* Extension: limited to ~25% Rt rotation: [facility] Lt rotation: [facility]   Cervical ROM: 02/09/2024 WFL in all direction   LOWER EXTREMITY ROM:     ROM Right Eval 02/09/2024 Left Eval 02/09/2024  Hip flexion (supine) PROM: WFL PROM: [facility]  Hip extension    Hip abduction    Hip adduction    Hip internal rotation    Hip external rotation    Knee flexion    Knee extension    Ankle dorsiflexion     Ankle plantarflexion    Ankle inversion    Ankle eversion     (Blank rows = not tested)  LOWER EXTREMITY MMT:  MMT Right Eval 02/09/2024 Left Eval 02/09/2024  Hip flexion (seated) 3+/5 4/5  Hip extension (prone) 2+/5 3+/5  Hip abduction (sidelying) 3/5 4-/5  Hip adduction    Hip internal rotation    Hip external rotation    Knee flexion (seated) 5/5 5/5  Knee extension (seated) 5/5 5/5  Ankle dorsiflexion (seated) 4+/5 4+/5  Ankle plantarflexion    Ankle inversion    Ankle eversion     (Blank rows = not tested)  LUMBAR SPECIAL TESTS:  Not assessed   FUNCTIONAL TESTS:  Not assessed   GAIT: Distance walked: not formally assessed  Assistive device utilized: None Level of assistance: supervision  Comments: not overt gait deviations noted   TREATMENT DATE:  02/09/2024 TherEx:  HEP handout provided with patient performing one set of each exercise for appropriate form. Verbal and tactile cues provided.   Self-Care:  POC                                                                                                                             PATIENT EDUCATION:  Education details: HEP, POC Person educated: Patient Education method: Explanation, Demonstration, Tactile cues, Verbal cues, and Handouts Education comprehension: verbalized understanding, returned demonstration, verbal cues required, and tactile cues required  HOME EXERCISE PROGRAM: Access Code: [email or web address] URL: [email or web address] Date: 02/09/2024 Prepared by: Susannah Daring  Exercises - Seated Scapular Retraction  - 1 x daily - 7 x weekly - 3 sets - 10 reps - Doorway Rhomboid Stretch  - 1 x daily - 7 x weekly - 3 sets - Seated Cervical Retraction  - 1 x daily - 7 x weekly - 3 sets - 10 reps - Sidelying Thoracic Rotation with Open Book  - 1 x daily - 7 x weekly - 3 sets - 10 reps - Cobra  - 1 x daily - 7 x weekly - 3 sets - 10 reps  ASSESSMENT:  CLINICAL IMPRESSION: Patient is a  43 y.o. F who was seen today for physical therapy evaluation and treatment for thoracic back pain presenting with postural deficits, generalized strength deficits, functional mobility deficits, and pain. Patient most limited by lack of muscular endurance secondary to pain and discomfort. Patient will benefit from skilled PT to address above noted deficits.   OBJECTIVE IMPAIRMENTS: decreased activity tolerance, decreased coordination, decreased endurance, decreased ROM, decreased strength, improper body mechanics, postural dysfunction, and pain.   ACTIVITY LIMITATIONS: carrying, lifting, bending, and standing  PARTICIPATION LIMITATIONS: cleaning, community activity, occupation, and yard work  PERSONAL FACTORS: Past/current experiences, Time since onset of injury/illness/exacerbation, and 1 comorbidity: depression are also affecting patient's functional outcome.   REHAB POTENTIAL: Good  CLINICAL DECISION MAKING: Stable/uncomplicated  EVALUATION COMPLEXITY: Low   GOALS: Goals reviewed with patient? Yes  SHORT TERM GOALS: Target date: 03/01/2024  Patient will show compliance with initial HEP.  Baseline: Goal status: INITIAL  2.  Patient will report pain levels no greater than 6/10 to show overall improvement  in quality of life. Baseline:  Goal status: INITIAL    LONG TERM GOALS: Target date: 04/05/2024  Patient will be independent with final HEP in order to maintain and progress upon functional gains made within PT. Baseline:  Goal status: INITIAL  2.  Patient will report pain levels no greater than 4/10 to show overall improvement in quality of life. Baseline:  Goal status: INITIAL  3.  Patient will increase PSFS to at least 5.33 to show a significant improvement in subjective disability.  Baseline:  Goal status: INITIAL  4.  Patient will increase thoracic extension ROM to at least 75% WFL in order to improve functional mobility. Baseline:  Goal status: INITIAL  5.   Patient will report ability to do work for at least 1 hour before requiring adjustments secondary to discomfort/pain. Baseline:  Goal status: INITIAL  6.  Patient will increase bilat hip extension MMT to at least 4-/5 in order to improve biomechanics with functional mobility.  Baseline:  Goal status: INITIAL  PLAN:  PT FREQUENCY: 1-2x/week  PT DURATION: 8 weeks  PLANNED INTERVENTIONS: 97164- PT Re-evaluation, 97750- Physical Performance Testing, 97110-Therapeutic exercises, 97530- Therapeutic activity, [id number]- Neuromuscular re-education, 97535- Self Care, 02859- Manual therapy, [id number]- Gait training, [phone]- Orthotic Initial, [phone]- Orthotic/Prosthetic subsequent, [phone]- Canalith repositioning, [phone]- Aquatic Therapy, [phone]- Electrical stimulation (unattended), [phone]- Electrical stimulation (manual), [id number]- Vasopneumatic device, [id number]- Ultrasound, [id number]- Ionotophoresis 4mg /ml Dexamethasone, 79439 (1-2 muscles), 20561 (3+ muscles)- Dry Needling, Patient/Family education, Balance training, Stair training, Taping, Joint mobilization, Joint manipulation, Spinal manipulation, Spinal mobilization, Scar mobilization, Compression bandaging, Vestibular training, DME instructions, Cryotherapy, and Moist heat.  PLAN FOR NEXT SESSION: review HEP and adjust as needed, postural strengthening, assess other musculoskeletal pain, manual (?), spinal musculature flexibility    Susannah Daring, PT, DPT 02/09/24 3:49 PM

## 2024-02-09 ENCOUNTER — Ambulatory Visit

## 2024-02-09 DIAGNOSIS — M6281 Muscle weakness (generalized): Secondary | ICD-10-CM

## 2024-02-09 DIAGNOSIS — M25561 Pain in right knee: Secondary | ICD-10-CM

## 2024-02-09 DIAGNOSIS — R293 Abnormal posture: Secondary | ICD-10-CM

## 2024-02-09 DIAGNOSIS — M25551 Pain in right hip: Secondary | ICD-10-CM | POA: Diagnosis not present

## 2024-02-09 DIAGNOSIS — G8929 Other chronic pain: Secondary | ICD-10-CM

## 2024-02-09 DIAGNOSIS — M546 Pain in thoracic spine: Secondary | ICD-10-CM

## 2024-02-09 DIAGNOSIS — M25511 Pain in right shoulder: Secondary | ICD-10-CM

## 2024-02-09 DIAGNOSIS — R29898 Other symptoms and signs involving the musculoskeletal system: Secondary | ICD-10-CM

## 2024-02-14 NOTE — Therapy (Signed)
 OUTPATIENT PHYSICAL THERAPY THORACOLUMBAR TREATMENT  Patient Name: Lori Vaughan MRN: [id number] DOB:1980/08/20, 43 y.o., female Today's Date: 02/16/2024  END OF SESSION:  PT End of Session - 02/16/24 0735     Visit Number 2    Number of Visits 16    Date for Recertification  04/05/24    Authorization Type BCBS 20% COINSURANCE, 30 VL, NO AUTH NEEDED    PT Start Time 1145    PT Stop Time 1225    PT Time Calculation (min) 40 min    Activity Tolerance Patient tolerated treatment well    Behavior During Therapy WFL for tasks assessed/performed           Past Medical History:  Diagnosis Date   Abnormal Pap smear 08/2014   LGSIL, 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  Not on meds now.   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   Supervision of normal pregnancy 07/02/2010    PCP: Robbi Brinks, PA-C  REFERRING PROVIDER: Duwaine FORBES Pouch, NP  REFERRING DIAG: [phone] (ICD-10-CM) - Chronic bilateral thoracic back pain M54.16 (ICD-10-CM) - Lumbar radiculopathy M25.551 (ICD-10-CM) - Pain in right hip M25.561,G89.29 (ICD-10-CM) - Chronic pain of right knee M79.18 (ICD-10-CM) - Myofascial pain syndrome  Rationale for Evaluation and Treatment: Rehabilitation  THERAPY DIAG:  Pain in thoracic spine  Chronic right shoulder pain  Pain in right hip  Chronic pain of right knee  Muscle weakness (generalized)  Other symptoms and signs involving the musculoskeletal system  Abnormal  posture  ONSET DATE: April 2025  SUBJECTIVE:                                                                                                                                                                                           SUBJECTIVE STATEMENT: Pt arrives with [place] interpretor. Pt reports still having pain affecting motion.  PERTINENT HISTORY:  Patient reports that she is having high levels of pain in thoracic musculature that begins 15-20 minutes after beginning work/standing. Patient also reports low back pain, Rt shoulder pain, Rt hip pain, and Rt knee pain, with intermittent pain in  xyphoid process with palpation. Patient has not had any trauma, injuries, or surgeries. Patient notes that she was given an anti-depressant that was supposed to assist with pain, but caused dizziness, n/v, sweating, and gastrointestinal distress so she has discontinued the medication.   See PMH or personal factors for in depth comorbidities   PAIN:  Are you having pain? Yes: NPRS scale: soreness and stiff at rest, 8/10 today  Pain location: mainly the thoracic spine Pain description: sharp, stabbing, burning  Aggravating factors: turning, bending, lifting, standing for a long time  Relieving factors: tylenol, stretching,   PRECAUTIONS: Other: given precautions for standing/rest at work   RED FLAGS: Bowel or bladder incontinence: No and Cauda equina syndrome: No   WEIGHT BEARING RESTRICTIONS: No  FALLS:  Has patient fallen in last 6 months? No  LIVING ENVIRONMENT: Lives with: lives with their family Lives in: House/apartment Stairs: No Has following equipment at home: None  OCCUPATION: works in a lab  PLOF: Independent  PATIENT GOALS: to make pain go away   NEXT MD VISIT: not currently scheduled   OBJECTIVE:  Note: Objective measures were completed at Evaluation unless otherwise noted.  DIAGNOSTIC FINDINGS:  IMPRESSION: 1. Degenerative disc bulging with facet hypertrophy  at L4-5 with resultant mild left greater than right lateral recess stenosis. 2. Advanced degenerative disc space narrowing with reactive endplate change at L5-S1 without significant stenosis or impingement. 3. Additional mild noncompressive disc bulging at L1-2 through L3-4 without significant stenosis.  PATIENT SURVEYS:  PSFS: THE PATIENT SPECIFIC FUNCTIONAL SCALE  Place score of 0-10 (0 = unable to perform activity and 10 = able to perform activity at the same level as before injury or problem)  Activity Date: 02/09/2024    Standing for a long time   0    2. Bending   5    3. Twisting   5    4.      Total Score 3.33      Total Score = Sum of activity scores/number of activities  Minimally Detectable Change: 3 points (for single activity); 2 points (for average score)  [facility] (nd). The Patient Specific Functional Scale . Retrieved from [email or web address]   COGNITION: Overall cognitive status: Within functional limits for tasks assessed     SENSATION: Not tested  MUSCLE LENGTH:   POSTURE: rounded shoulders, forward head, and increased thoracic kyphosis  PALPATION: TTP in Rt hip, Rt SIJ; not TTP to thoracic spine or lumbar spine; TTP in xyphoid process   LUMBAR ROM:   AROM Eval 02/09/2024  Flexion WFL  Extension Highly limited   Right lateral flexion WFL  Left lateral flexion WFL  Right rotation WFL  Left rotation WFL   (Blank rows = not tested)   Thoracic ROM: 02/09/2024 Flexion: limited to ~25-50* Extension: limited to ~25% Rt rotation: [facility] Lt rotation: [facility]   Cervical ROM: 02/09/2024 WFL in all direction   LOWER EXTREMITY ROM:     ROM Right Eval 02/09/2024 Left Eval 02/09/2024  Hip flexion (supine) PROM: [facility] PROM: [facility]  Hip extension    Hip abduction    Hip adduction    Hip internal rotation    Hip external rotation    Knee flexion    Knee extension    Ankle dorsiflexion     Ankle plantarflexion    Ankle inversion    Ankle eversion     (Blank rows = not tested)  LOWER EXTREMITY MMT:    MMT Right Eval 02/09/2024  Left Eval 02/09/2024  Hip flexion (seated) 3+/5 4/5  Hip extension (prone) 2+/5 3+/5  Hip abduction (sidelying) 3/5 4-/5  Hip adduction    Hip internal rotation    Hip external rotation    Knee flexion (seated) 5/5 5/5  Knee extension (seated) 5/5 5/5  Ankle dorsiflexion (seated) 4+/5 4+/5  Ankle plantarflexion    Ankle inversion    Ankle eversion     (Blank rows = not tested)  LUMBAR SPECIAL TESTS:  Not assessed   FUNCTIONAL TESTS:  Not assessed   GAIT: Distance walked: not formally assessed  Assistive device utilized: None Level of assistance: supervision  Comments: not overt gait deviations noted   TREATMENT DATE:  02/15/24 UBE 4 min backwards for posture T band rows and extensions for posture 3x10 Green Cobra 3x10 Rhomboid stretch doorway 2x30 sec bilateral Standing lumbar extensions 15x Standing hip extensions 2x10 bilateral Chest press with 2# bar 2x10 Punches with 2# DB 3x10   02/09/2024 TherEx:  HEP handout provided with patient performing one set of each exercise for appropriate form. Verbal and tactile cues provided.   Self-Care:  POC                                                                                                                             PATIENT EDUCATION:  Education details: HEP, POC Person educated: Patient Education method: Explanation, Demonstration, Tactile cues, Verbal cues, and Handouts Education comprehension: verbalized understanding, returned demonstration, verbal cues required, and tactile cues required  HOME EXERCISE PROGRAM: Access Code: [email or web address] URL: [email or web address] Date: 02/09/2024 Prepared by: Susannah Daring  Exercises - Seated Scapular Retraction  - 1 x daily - 7 x weekly - 3 sets - 10 reps - Doorway Rhomboid Stretch  - 1 x daily - 7 x  weekly - 3 sets - Seated Cervical Retraction  - 1 x daily - 7 x weekly - 3 sets - 10 reps - Sidelying Thoracic Rotation with Open Book  - 1 x daily - 7 x weekly - 3 sets - 10 reps - Cobra  - 1 x daily - 7 x weekly - 3 sets - 10 reps  ASSESSMENT:  CLINICAL IMPRESSION: Patient needed VC/TC for posture with standing exercises.  Demonstrated understanding.  OBJECTIVE IMPAIRMENTS: decreased activity tolerance, decreased coordination, decreased endurance, decreased ROM, decreased strength, improper body mechanics, postural dysfunction, and pain.   ACTIVITY LIMITATIONS: carrying, lifting, bending, and standing  PARTICIPATION LIMITATIONS: cleaning, community activity, occupation, and yard work  PERSONAL FACTORS: Past/current experiences, Time since onset of injury/illness/exacerbation, and 1 comorbidity: depression are also affecting patient's functional outcome.   REHAB POTENTIAL: Good  CLINICAL DECISION MAKING: Stable/uncomplicated  EVALUATION COMPLEXITY: Low   GOALS: Goals reviewed with patient? Yes  SHORT TERM GOALS: Target date: 03/01/2024  Patient will show compliance with initial HEP.  Baseline: Goal status: INITIAL  2.  Patient will report pain levels no greater than 6/10 to show overall improvement in quality of life.  Baseline:  Goal status: INITIAL    LONG TERM GOALS: Target date: 04/05/2024  Patient will be independent with final HEP in order to maintain and progress upon functional gains made within PT. Baseline:  Goal status: INITIAL  2.  Patient will report pain levels no greater than 4/10 to show overall improvement in quality of life. Baseline:  Goal status: INITIAL  3.  Patient will increase PSFS to at least 5.33 to show a significant improvement in subjective disability.  Baseline:  Goal status: INITIAL  4.  Patient will increase thoracic extension ROM to at least 75% WFL in order to improve functional mobility. Baseline:  Goal status: INITIAL  5.   Patient will report ability to do work for at least 1 hour before requiring adjustments secondary to discomfort/pain. Baseline:  Goal status: INITIAL  6.  Patient will increase bilat hip extension MMT to at least 4-/5 in order to improve biomechanics with functional mobility.  Baseline:  Goal status: INITIAL  PLAN:  PT FREQUENCY: 1-2x/week  PT DURATION: 8 weeks  PLANNED INTERVENTIONS: 97164- PT Re-evaluation, 97750- Physical Performance Testing, 97110-Therapeutic exercises, 97530- Therapeutic activity, [id number]- Neuromuscular re-education, 97535- Self Care, 02859- Manual therapy, [id number]- Gait training, [phone]- Orthotic Initial, [phone]- Orthotic/Prosthetic subsequent, [phone]- Canalith repositioning, [phone]- Aquatic Therapy, [phone]- Electrical stimulation (unattended), [phone]- Electrical stimulation (manual), [id number]- Vasopneumatic device, [id number]- Ultrasound, [id number]- Ionotophoresis 4mg /ml Dexamethasone, 79439 (1-2 muscles), 20561 (3+ muscles)- Dry Needling, Patient/Family education, Balance training, Stair training, Taping, Joint mobilization, Joint manipulation, Spinal manipulation, Spinal mobilization, Scar mobilization, Compression bandaging, Vestibular training, DME instructions, Cryotherapy, and Moist heat.  PLAN FOR NEXT SESSION: postural strengthening   Burnard Meth, PT 02/16/24  7:39 AM

## 2024-02-15 ENCOUNTER — Telehealth: Payer: Self-pay | Admitting: Physical Medicine and Rehabilitation

## 2024-02-15 ENCOUNTER — Encounter: Payer: Self-pay | Admitting: Physical Medicine and Rehabilitation

## 2024-02-15 ENCOUNTER — Telehealth: Payer: Self-pay

## 2024-02-15 ENCOUNTER — Ambulatory Visit (INDEPENDENT_AMBULATORY_CARE_PROVIDER_SITE_OTHER)

## 2024-02-15 DIAGNOSIS — M6281 Muscle weakness (generalized): Secondary | ICD-10-CM

## 2024-02-15 DIAGNOSIS — R293 Abnormal posture: Secondary | ICD-10-CM

## 2024-02-15 DIAGNOSIS — R29898 Other symptoms and signs involving the musculoskeletal system: Secondary | ICD-10-CM

## 2024-02-15 DIAGNOSIS — M25511 Pain in right shoulder: Secondary | ICD-10-CM | POA: Diagnosis not present

## 2024-02-15 DIAGNOSIS — M546 Pain in thoracic spine: Secondary | ICD-10-CM | POA: Diagnosis not present

## 2024-02-15 DIAGNOSIS — G8929 Other chronic pain: Secondary | ICD-10-CM

## 2024-02-15 DIAGNOSIS — M25561 Pain in right knee: Secondary | ICD-10-CM

## 2024-02-15 DIAGNOSIS — M25551 Pain in right hip: Secondary | ICD-10-CM | POA: Diagnosis not present

## 2024-02-15 NOTE — Telephone Encounter (Signed)
 Error

## 2024-02-15 NOTE — Telephone Encounter (Signed)
 Patient in lobby requesting updated work letter.

## 2024-02-15 NOTE — Telephone Encounter (Signed)
 Patient was here. She needs an updated letter for work with a new date. Extending her restrictions until she is done with PT which should be the end of December. She would like the note put in her mychart.

## 2024-02-15 NOTE — Telephone Encounter (Signed)
 LMX1 for Patient to call

## 2024-02-21 ENCOUNTER — Encounter: Payer: Self-pay | Admitting: Radiology

## 2024-02-22 NOTE — Therapy (Signed)
 OUTPATIENT PHYSICAL THERAPY THORACOLUMBAR TREATMENT  Patient Name: Lori Vaughan MRN: [id number] DOB:1980/12/31, 43 y.o., female Today's Date: 02/23/2024  END OF SESSION:  PT End of Session - 02/23/24 1101     Visit Number 3    Number of Visits 16    Date for Recertification  04/05/24    Authorization Type [employer] 20% [employer]ANCE, 30 VL, NO AUTH NEEDED    PT Start Time 1101    PT Stop Time 1140    PT Time Calculation (min) 39 min    Activity Tolerance Patient tolerated treatment well    Behavior During Therapy WFL for tasks assessed/performed            Past Medical History:  Diagnosis Date   Abnormal Pap smear 08/2014   LGSIL, 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  Not on meds now.   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   Supervision of normal pregnancy 07/02/2010    PCP: Robbi Brinks, PA-C  REFERRING PROVIDER: Duwaine FORBES Pouch, NP  REFERRING DIAG: [phone] (ICD-10-CM) - Chronic bilateral thoracic back pain M54.16 (ICD-10-CM) - Lumbar radiculopathy M25.551 (ICD-10-CM) - Pain in right hip M25.561,G89.29 (ICD-10-CM) - Chronic pain of right knee M79.18 (ICD-10-CM) - Myofascial pain syndrome  Rationale for Evaluation and Treatment: Rehabilitation  THERAPY DIAG:  Pain in thoracic spine  Chronic right shoulder pain  Pain in right hip  Chronic pain of right knee  Muscle weakness (generalized)  Other symptoms and signs involving the musculoskeletal system  Abnormal  posture  ONSET DATE: April 2025  SUBJECTIVE:                                                                                                                                                                                           SUBJECTIVE STATEMENT: Patient arrives noting continued difficulty with muscular endurance.   PERTINENT HISTORY:  Patient reports that she is having high levels of pain in thoracic musculature that begins 15-20 minutes after beginning work/standing. Patient also reports low back pain, Rt shoulder pain, Rt hip pain, and Rt knee pain, with intermittent pain in xyphoid process with  palpation. Patient has not had any trauma, injuries, or surgeries. Patient notes that she was given an anti-depressant that was supposed to assist with pain, but caused dizziness, n/v, sweating, and gastrointestinal distress so she has discontinued the medication.   See PMH or personal factors for in depth comorbidities   PAIN:  Are you having pain? Yes: NPRS scale: 0/10 at beginning of session   Pain location: mainly the thoracic spine Pain description: sharp, stabbing, burning  Aggravating factors: turning, bending, lifting, standing for a long time  Relieving factors: tylenol, stretching,   PRECAUTIONS: Other: given precautions for standing/rest at work   RED FLAGS: Bowel or bladder incontinence: No and Cauda equina syndrome: No   WEIGHT BEARING RESTRICTIONS: No  FALLS:  Has patient fallen in last 6 months? No  LIVING ENVIRONMENT: Lives with: lives with their family Lives in: House/apartment Stairs: No Has following equipment at home: None  OCCUPATION: works in a lab  PLOF: Independent  PATIENT GOALS: to make pain go away   NEXT MD VISIT: not currently scheduled   OBJECTIVE:  Note: Objective measures were completed at Evaluation unless otherwise noted.  DIAGNOSTIC FINDINGS:  IMPRESSION: 1. Degenerative disc bulging with facet hypertrophy at L4-5 with resultant  mild left greater than right lateral recess stenosis. 2. Advanced degenerative disc space narrowing with reactive endplate change at L5-S1 without significant stenosis or impingement. 3. Additional mild noncompressive disc bulging at L1-2 through L3-4 without significant stenosis.  PATIENT SURVEYS:  PSFS: THE PATIENT SPECIFIC FUNCTIONAL SCALE  Place score of 0-10 (0 = unable to perform activity and 10 = able to perform activity at the same level as before injury or problem)  Activity Date: 02/09/2024    Standing for a long time   0    2. Bending   5    3. Twisting   5    4.      Total Score 3.33      Total Score = Sum of activity scores/number of activities  Minimally Detectable Change: 3 points (for single activity); 2 points (for average score)  [facility] (nd). The Patient Specific Functional Scale . Retrieved from [email or web address]   COGNITION: Overall cognitive status: Within functional limits for tasks assessed     SENSATION: Not tested  MUSCLE LENGTH:   POSTURE: rounded shoulders, forward head, and increased thoracic kyphosis  PALPATION: TTP in Rt hip, Rt SIJ; not TTP to thoracic spine or lumbar spine; TTP in xyphoid process   LUMBAR ROM:   AROM Eval 02/09/2024  Flexion WFL  Extension Highly limited   Right lateral flexion WFL  Left lateral flexion WFL  Right rotation WFL  Left rotation WFL   (Blank rows = not tested)   Thoracic ROM: 02/09/2024 Flexion: limited to ~25-50* Extension: limited to ~25% Rt rotation: [facility] Lt rotation: [facility]   Cervical ROM: 02/09/2024 WFL in all direction   LOWER EXTREMITY ROM:     ROM Right Eval 02/09/2024 Left Eval 02/09/2024  Hip flexion (supine) PROM: [facility] PROM: [facility]  Hip extension    Hip abduction    Hip adduction    Hip internal rotation    Hip external rotation    Knee flexion    Knee extension    Ankle dorsiflexion    Ankle  plantarflexion    Ankle inversion    Ankle eversion     (Blank rows = not tested)  LOWER EXTREMITY MMT:    MMT Right Eval 02/09/2024 Left Eval 02/09/2024  Hip flexion (seated) 3+/5 4/5  Hip extension (prone) 2+/5 3+/5  Hip abduction (sidelying) 3/5 4-/5  Hip adduction    Hip internal rotation    Hip external rotation    Knee flexion (seated) 5/5 5/5  Knee extension (seated) 5/5 5/5  Ankle dorsiflexion (seated) 4+/5 4+/5  Ankle plantarflexion    Ankle inversion    Ankle eversion     (Blank rows = not tested)  LUMBAR SPECIAL TESTS:  Not assessed   FUNCTIONAL TESTS:  Not assessed   GAIT: Distance walked: not formally assessed  Assistive device utilized: None Level of assistance: supervision  Comments: not overt gait deviations noted   TREATMENT DATE:  02/23/2024 TherEx:  Nustep level 5 with bilat UE and LE for 8 minutes  Seated rhomboid stretch 2x30s (added to HEP to replace doorway stretch) Kneeling thoracic extension with red theraball on mat 3x30s  Squats on a decline with 8# DB 1x10  Neuro Re-Ed: Seated ER with scapular retraction using red TB 2x10  Standing rows with blue TB 2x12 with 2s hold   Standing straight arm pulldowns with green TB 2x12 with 2s hold    02/15/24 UBE 4 min backwards for posture T band rows and extensions for posture 3x10 Green Cobra 3x10 Rhomboid stretch doorway 2x30 sec bilateral Standing lumbar extensions 15x Standing hip extensions 2x10 bilateral Chest press with 2# bar 2x10 Punches with 2# DB 3x10   02/09/2024 TherEx:  HEP handout provided with patient performing one set of each exercise for appropriate form. Verbal and tactile cues provided.   Self-Care:  POC                                                                                                                             PATIENT EDUCATION:  Education details: HEP, POC Person educated: Patient Education method: Explanation, Demonstration, Tactile cues,  Verbal cues, and Handouts Education comprehension: verbalized understanding, returned demonstration, verbal cues required, and tactile cues required  HOME EXERCISE PROGRAM: Access Code: [email or web address] URL: [email or web address] Date: 02/09/2024 Prepared by: Susannah Daring  Exercises - Seated Scapular Retraction  - 1 x daily - 7 x weekly - 3 sets - 10 reps - Doorway Rhomboid Stretch  - 1 x daily - 7 x weekly - 3 sets - Seated Cervical Retraction  - 1 x daily - 7 x weekly - 3 sets - 10 reps - Sidelying Thoracic Rotation with Open Book  - 1 x daily - 7 x weekly - 3 sets - 10 reps - Cobra  - 1 x daily - 7 x weekly - 3 sets - 10 reps  ASSESSMENT:  CLINICAL IMPRESSION: Patient arrived to session noting no change in symptoms with work. Patient tolerated all activities this date and found improvement in symptoms with rhomboid stretch when switching to seated. Patient will continue to benefit from skilled PT.   OBJECTIVE IMPAIRMENTS: decreased activity tolerance, decreased coordination, decreased endurance, decreased ROM, decreased strength, improper body mechanics,  postural dysfunction, and pain.   ACTIVITY LIMITATIONS: carrying, lifting, bending, and standing  PARTICIPATION LIMITATIONS: cleaning, community activity, occupation, and yard work  PERSONAL FACTORS: Past/current experiences, Time since onset of injury/illness/exacerbation, and 1 comorbidity: depression are also affecting patient's functional outcome.   REHAB POTENTIAL: Good  CLINICAL DECISION MAKING: Stable/uncomplicated  EVALUATION COMPLEXITY: Low   GOALS: Goals reviewed with patient? Yes  SHORT TERM GOALS: Target date: 03/01/2024  Patient will show compliance with initial HEP.  Baseline: Goal status: INITIAL  2.  Patient will report pain levels no greater than 6/10 to show overall improvement in quality of life. Baseline:  Goal status: INITIAL    LONG TERM GOALS: Target date: 04/05/2024  Patient will  be independent with final HEP in order to maintain and progress upon functional gains made within PT. Baseline:  Goal status: INITIAL  2.  Patient will report pain levels no greater than 4/10 to show overall improvement in quality of life. Baseline:  Goal status: INITIAL  3.  Patient will increase PSFS to at least 5.33 to show a significant improvement in subjective disability.  Baseline:  Goal status: INITIAL  4.  Patient will increase thoracic extension ROM to at least 75% WFL in order to improve functional mobility. Baseline:  Goal status: INITIAL  5.  Patient will report ability to do work for at least 1 hour before requiring adjustments secondary to discomfort/pain. Baseline:  Goal status: INITIAL  6.  Patient will increase bilat hip extension MMT to at least 4-/5 in order to improve biomechanics with functional mobility.  Baseline:  Goal status: INITIAL  PLAN:  PT FREQUENCY: 1-2x/week  PT DURATION: 8 weeks  PLANNED INTERVENTIONS: 97164- PT Re-evaluation, 97750- Physical Performance Testing, 97110-Therapeutic exercises, 97530- Therapeutic activity, [id number]- Neuromuscular re-education, 97535- Self Care, 02859- Manual therapy, [id number]- Gait training, [phone]- Orthotic Initial, [phone]- Orthotic/Prosthetic subsequent, [phone]- Canalith repositioning, [phone]- Aquatic Therapy, [phone]- Electrical stimulation (unattended), [phone]- Electrical stimulation (manual), [id number]- Vasopneumatic device, [id number]- Ultrasound, [id number]- Ionotophoresis 4mg /ml Dexamethasone, 79439 (1-2 muscles), 20561 (3+ muscles)- Dry Needling, Patient/Family education, Balance training, Stair training, Taping, Joint mobilization, Joint manipulation, Spinal manipulation, Spinal mobilization, Scar mobilization, Compression bandaging, Vestibular training, DME instructions, Cryotherapy, and Moist heat.  PLAN FOR NEXT SESSION:  postural strengthening   Susannah Daring, PT, DPT 02/23/24 12:58 PM  .

## 2024-02-23 ENCOUNTER — Ambulatory Visit

## 2024-02-23 DIAGNOSIS — M25551 Pain in right hip: Secondary | ICD-10-CM | POA: Diagnosis not present

## 2024-02-23 DIAGNOSIS — M25511 Pain in right shoulder: Secondary | ICD-10-CM | POA: Diagnosis not present

## 2024-02-23 DIAGNOSIS — G8929 Other chronic pain: Secondary | ICD-10-CM

## 2024-02-23 DIAGNOSIS — M25561 Pain in right knee: Secondary | ICD-10-CM

## 2024-02-23 DIAGNOSIS — R293 Abnormal posture: Secondary | ICD-10-CM

## 2024-02-23 DIAGNOSIS — M546 Pain in thoracic spine: Secondary | ICD-10-CM | POA: Diagnosis not present

## 2024-02-23 DIAGNOSIS — M6281 Muscle weakness (generalized): Secondary | ICD-10-CM

## 2024-02-23 DIAGNOSIS — R29898 Other symptoms and signs involving the musculoskeletal system: Secondary | ICD-10-CM

## 2024-02-28 NOTE — Therapy (Incomplete)
 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  Not on meds now.   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  ONSET DATE: April 2025  SUBJECTIVE:                                                                                                                                                                                           SUBJECTIVE STATEMENT: *** Patient arrives noting continued difficulty with muscular endurance.   PERTINENT HISTORY:  Patient reports that she is having high levels of pain in thoracic musculature that begins 15-20 minutes after beginning work/standing. Patient also reports low back pain, Rt shoulder pain, Rt hip pain, and Rt knee pain, with intermittent pain in  xyphoid process with palpation. Patient has not had any trauma, injuries, or surgeries. Patient notes that she was given an anti-depressant that was supposed to assist with pain, but caused dizziness, n/v, sweating, and gastrointestinal distress so she has discontinued the medication.   See PMH or personal factors for in depth comorbidities   PAIN:  Are you having pain? Yes: NPRS scale: 0/10 at beginning of session   Pain location: mainly the thoracic spine Pain description: sharp, stabbing, burning  Aggravating factors: turning, bending, lifting, standing for a long time  Relieving factors: tylenol, stretching,   PRECAUTIONS: Other: given precautions for standing/rest at work   RED FLAGS: Bowel or bladder incontinence: No and Cauda equina syndrome: No   WEIGHT BEARING  RESTRICTIONS: No  FALLS:  Has patient fallen in last 6 months? No  LIVING ENVIRONMENT: Lives with: lives with their family Lives in: House/apartment Stairs: No Has following equipment at home: None  OCCUPATION: works in a lab  PLOF: Independent  PATIENT GOALS: to make pain go away   NEXT MD VISIT: not currently scheduled   OBJECTIVE:  Note: Objective measures were completed at Evaluation unless otherwise noted.  DIAGNOSTIC FINDINGS:  IMPRESSION: 1. Degenerative disc bulging with facet hypertrophy at L4-5 with resultant mild left greater than right lateral recess stenosis. 2. Advanced degenerative disc space narrowing with reactive endplate change at L5-S1 without significant stenosis or impingement. 3. Additional mild noncompressive disc bulging at L1-2 through L3-4 without significant stenosis.  PATIENT SURVEYS:  PSFS: THE PATIENT SPECIFIC FUNCTIONAL SCALE  Place score of 0-10 (0 = unable to perform activity and 10 = able to perform activity at the same level as before injury or problem)  Activity Date: 02/09/2024    Standing for a long time   0    2. Bending   5    3. Twisting   5    4.       Total Score 3.33      Total Score = Sum of activity scores/number of activities  Minimally Detectable Change: 3 points (for single activity); 2 points (for average score)  [facility] (nd). The Patient Specific Functional Scale . Retrieved from [email or web address]   COGNITION: Overall cognitive status: Within functional limits for tasks assessed     SENSATION: Not tested  MUSCLE LENGTH:   POSTURE: rounded shoulders, forward head, and increased thoracic kyphosis  PALPATION: TTP in Rt hip, Rt SIJ; not TTP to thoracic spine or lumbar spine; TTP in xyphoid process   LUMBAR ROM:   AROM Eval 02/09/2024  Flexion WFL  Extension Highly limited   Right lateral flexion WFL  Left lateral flexion WFL  Right rotation WFL  Left rotation WFL   (Blank rows = not tested)   Thoracic ROM: 02/09/2024 Flexion: limited to ~25-50* Extension: limited to ~25% Rt rotation: [facility] Lt rotation: [facility]   Cervical ROM: 02/09/2024 WFL in all direction   LOWER EXTREMITY ROM:     ROM Right Eval 02/09/2024 Left Eval 02/09/2024  Hip flexion (supine) PROM: [facility] PROM: [facility]  Hip extension    Hip abduction    Hip adduction    Hip internal rotation    Hip external rotation    Knee flexion    Knee extension    Ankle dorsiflexion    Ankle plantarflexion    Ankle inversion    Ankle eversion     (Blank rows = not tested)  LOWER EXTREMITY MMT:    MMT Right Eval 02/09/2024 Left Eval 02/09/2024  Hip flexion (seated) 3+/5 4/5  Hip extension (prone) 2+/5 3+/5  Hip abduction (sidelying) 3/5 4-/5  Hip adduction    Hip internal rotation    Hip external rotation    Knee flexion (seated) 5/5 5/5  Knee extension (seated) 5/5 5/5  Ankle dorsiflexion (seated) 4+/5 4+/5  Ankle plantarflexion    Ankle inversion    Ankle eversion     (Blank rows = not tested)  LUMBAR SPECIAL TESTS:  Not assessed   FUNCTIONAL TESTS:  Not assessed    GAIT: Distance walked: not formally assessed  Assistive device utilized: None Level of assistance: supervision  Comments: not overt gait deviations noted   TREATMENT DATE:  02/29/2024 ***  02/23/2024 TherEx:  Nustep level 5 with bilat UE and LE for 8 minutes  Seated rhomboid stretch 2x30s (added to HEP to replace doorway stretch) Kneeling thoracic extension with red theraball on mat 3x30s  Squats on a decline with 8# DB 1x10  Neuro Re-Ed: Seated ER with scapular retraction using red TB 2x10  Standing rows with blue TB 2x12 with 2s hold   Standing straight arm pulldowns with green TB 2x12 with 2s hold    02/15/24 UBE 4 min backwards for posture T band rows and extensions for posture 3x10 Green Cobra 3x10 Rhomboid stretch doorway 2x30 sec bilateral Standing lumbar extensions 15x Standing hip extensions 2x10 bilateral Chest press with 2# bar 2x10 Punches with 2# DB 3x10   02/09/2024 TherEx:  HEP handout provided with patient performing one set of each exercise for appropriate form. Verbal and tactile cues provided.   Self-Care:  POC                                                                                                                             PATIENT EDUCATION:  Education details: HEP, POC Person educated: Patient Education method: Explanation, Demonstration, Tactile cues, Verbal cues, and Handouts Education comprehension: verbalized understanding, returned demonstration, verbal cues required, and tactile cues required  HOME EXERCISE PROGRAM: Access Code: [email or web address] URL: [email or web address] Date: 02/09/2024 Prepared by: Susannah Daring  Exercises - Seated Scapular Retraction  - 1 x daily - 7 x weekly - 3 sets - 10 reps - Doorway Rhomboid Stretch  - 1 x daily - 7 x weekly - 3 sets - Seated Cervical Retraction  - 1 x daily - 7 x weekly - 3 sets - 10 reps - Sidelying Thoracic Rotation with Open Book  - 1 x daily - 7 x weekly - 3 sets - 10  reps - Cobra  - 1 x daily - 7 x weekly - 3 sets - 10 reps  ASSESSMENT:  CLINICAL IMPRESSION: *** Patient arrived to session noting no change in symptoms with work. Patient tolerated all activities this date and found improvement in symptoms with rhomboid stretch when switching to seated. Patient will continue to benefit from skilled PT.   OBJECTIVE IMPAIRMENTS: decreased activity tolerance, decreased coordination, decreased endurance, decreased ROM, decreased strength, improper body mechanics, postural dysfunction, and pain.   ACTIVITY LIMITATIONS: carrying, lifting, bending, and standing  PARTICIPATION LIMITATIONS: cleaning, community activity, occupation, and yard work  PERSONAL FACTORS: Past/current experiences, Time since onset of injury/illness/exacerbation, and 1 comorbidity: depression are also affecting patient's functional outcome.   REHAB POTENTIAL: Good  CLINICAL DECISION MAKING: Stable/uncomplicated  EVALUATION COMPLEXITY: Low   GOALS: Goals reviewed with patient? Yes  SHORT TERM GOALS: Target date: 03/01/2024  Patient will show compliance with initial HEP.  Baseline: Goal status: INITIAL  2.  Patient will report pain levels no greater than 6/10 to show overall improvement in quality of life. Baseline:  Goal status: INITIAL    LONG TERM GOALS: Target date: 04/05/2024  Patient will be independent with final HEP in order to maintain and progress upon functional gains made within PT. Baseline:  Goal status: INITIAL  2.  Patient will report pain levels no greater than 4/10 to show overall improvement in quality of life. Baseline:  Goal status: INITIAL  3.  Patient will increase PSFS to at least 5.33 to show a significant improvement in subjective disability.  Baseline:  Goal status: INITIAL  4.  Patient will increase thoracic extension ROM to at least 75% WFL in order to improve functional mobility. Baseline:  Goal status: INITIAL  5.  Patient will  report ability to do work for at least 1 hour before requiring adjustments secondary to discomfort/pain. Baseline:  Goal status: INITIAL  6.  Patient will increase bilat hip extension MMT to at least 4-/5 in order to improve biomechanics with functional mobility.  Baseline:  Goal status: INITIAL  PLAN:  PT FREQUENCY: 1-2x/week  PT DURATION: 8 weeks  PLANNED INTERVENTIONS: 97164- PT Re-evaluation, 97750- Physical Performance Testing, 97110-Therapeutic exercises, 97530- Therapeutic activity, [id number]- Neuromuscular re-education, 97535- Self Care, 02859- Manual therapy, [id number]- Gait training, [phone]- Orthotic Initial, [phone]- Orthotic/Prosthetic subsequent, [phone]- Canalith repositioning, [phone]- Aquatic Therapy, [phone]- Electrical stimulation (unattended), [phone]- Electrical stimulation (manual), [id number]- Vasopneumatic device, [id number]- Ultrasound, [id number]- Ionotophoresis 4mg /ml Dexamethasone, 79439 (1-2 muscles), 20561 (3+ muscles)- Dry Needling, Patient/Family education, Balance training, Stair training, Taping, Joint mobilization, Joint manipulation, Spinal manipulation, Spinal mobilization, Scar mobilization, Compression bandaging, Vestibular training, DME instructions, Cryotherapy, and Moist heat.  PLAN FOR NEXT SESSION:  *** postural strengthening   Susannah Daring, PT, DPT 02/28/24 12:55 PM  .

## 2024-02-29 ENCOUNTER — Encounter

## 2024-03-03 NOTE — Therapy (Addendum)
 " OUTPATIENT PHYSICAL THERAPY THORACOLUMBAR TREATMENT / DISCHARGE  Patient Name: Lori Vaughan MRN: [id number] DOB:1980/12/16, 43 y.o., female Today's Date: 03/06/2024  END OF SESSION:  PT End of Session - 03/06/24 1103     Visit Number 4    Number of Visits 16    Date for Recertification  04/05/24    Authorization Type BCBS 20% COINSURANCE, 30 VL, NO AUTH NEEDED    PT Start Time 1103    PT Stop Time 1142    PT Time Calculation (min) 39 min    Activity Tolerance Patient tolerated treatment well    Behavior During Therapy WFL for tasks assessed/performed           Past Medical History:  Diagnosis Date   Abnormal Pap smear 08/2014   LGSIL, 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  Not on meds now.   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   Supervision of normal pregnancy 07/02/2010    PCP: Robbi Brinks, PA-C  REFERRING PROVIDER: Duwaine FORBES Pouch, NP  REFERRING DIAG: [phone] (ICD-10-CM) - Chronic bilateral thoracic back pain M54.16 (ICD-10-CM) - Lumbar radiculopathy M25.551 (ICD-10-CM) - Pain in right hip M25.561,G89.29 (ICD-10-CM) - Chronic pain of right knee M79.18 (ICD-10-CM) - Myofascial pain syndrome  Rationale for Evaluation and Treatment: Rehabilitation  THERAPY DIAG:  Pain in thoracic spine  Chronic right shoulder pain  Pain in right hip  Chronic pain of right knee  Muscle weakness (generalized)  Other symptoms and signs involving the musculoskeletal system  Abnormal  posture  ONSET DATE: April 2025  SUBJECTIVE:                                                                                                                                                                                           SUBJECTIVE STATEMENT: Patient reports improvement in symptoms secondary to receiving a general break with family visiting. Patient endorsing trying to do more exercises prior to work.   PERTINENT HISTORY:  Patient reports that she is having high levels of pain in thoracic musculature that begins 15-20 minutes after beginning work/standing. Patient also reports low back  pain, Rt shoulder pain, Rt hip pain, and Rt knee pain, with intermittent pain in xyphoid process with palpation. Patient has not had any trauma, injuries, or surgeries. Patient notes that she was given an anti-depressant that was supposed to assist with pain, but caused dizziness, n/v, sweating, and gastrointestinal distress so she has discontinued the medication.   See PMH or personal factors for in depth comorbidities   PAIN:  Are you having pain? Yes: NPRS scale: 0/10 currently  Pain location: mainly the thoracic spine Pain description: sharp, stabbing, burning  Aggravating factors: turning, bending, lifting, standing for a long time  Relieving factors: tylenol, stretching,   PRECAUTIONS: Other: given precautions for standing/rest at work   RED FLAGS: Bowel or bladder incontinence: No and Cauda equina syndrome: No   WEIGHT BEARING RESTRICTIONS: No  FALLS:  Has patient fallen in last 6 months? No  LIVING ENVIRONMENT: Lives with: lives with their family Lives in: House/apartment Stairs: No Has following equipment at home: None  OCCUPATION: works in a lab  PLOF: Independent  PATIENT GOALS: to make pain go away   NEXT MD VISIT: not currently scheduled   OBJECTIVE:  Note: Objective measures were completed at Evaluation unless otherwise noted.  DIAGNOSTIC FINDINGS:   IMPRESSION: 1. Degenerative disc bulging with facet hypertrophy at L4-5 with resultant mild left greater than right lateral recess stenosis. 2. Advanced degenerative disc space narrowing with reactive endplate change at L5-S1 without significant stenosis or impingement. 3. Additional mild noncompressive disc bulging at L1-2 through L3-4 without significant stenosis.  PATIENT SURVEYS:  PSFS: THE PATIENT SPECIFIC FUNCTIONAL SCALE  Place score of 0-10 (0 = unable to perform activity and 10 = able to perform activity at the same level as before injury or problem)  Activity Date: 02/09/2024    Standing for a long time   0    2. Bending   5    3. Twisting   5    4.      Total Score 3.33      Total Score = Sum of activity scores/number of activities  Minimally Detectable Change: 3 points (for single activity); 2 points (for average score)  [facility] (nd). The Patient Specific Functional Scale . Retrieved from [email or web address]   COGNITION: Overall cognitive status: Within functional limits for tasks assessed     SENSATION: Not tested  MUSCLE LENGTH:   POSTURE: rounded shoulders, forward head, and increased thoracic kyphosis  PALPATION: TTP in Rt hip, Rt SIJ; not TTP to thoracic spine or lumbar spine; TTP in xyphoid process   LUMBAR ROM:   AROM Eval 02/09/2024  Flexion WFL  Extension Highly limited   Right lateral flexion WFL  Left lateral flexion WFL  Right rotation WFL  Left rotation WFL   (Blank rows = not tested)   Thoracic ROM: 02/09/2024 Flexion: limited to ~25-50* Extension: limited to ~25% Rt rotation: [facility] rotation: [facility]   Cervical ROM: 02/09/2024 WFL in all direction   LOWER EXTREMITY ROM:     ROM Right Eval 02/09/2024 Left Eval 02/09/2024  Hip flexion (supine) PROM: WFL PROM: [facility]  Hip extension    Hip abduction    Hip adduction    Hip internal rotation    Hip external  rotation    Knee flexion    Knee extension    Ankle dorsiflexion    Ankle plantarflexion    Ankle inversion    Ankle eversion     (Blank rows = not tested)  LOWER EXTREMITY MMT:    MMT Right Eval 02/09/2024 Left Eval 02/09/2024  Hip flexion (seated) 3+/5 4/5  Hip extension (prone) 2+/5 3+/5  Hip abduction (sidelying) 3/5 4-/5  Hip adduction    Hip internal rotation    Hip external rotation    Knee flexion (seated) 5/5 5/5  Knee extension (seated) 5/5 5/5  Ankle dorsiflexion (seated) 4+/5 4+/5  Ankle plantarflexion    Ankle inversion    Ankle eversion     (Blank rows = not tested)  LUMBAR SPECIAL TESTS:  Not assessed   FUNCTIONAL TESTS:  Not assessed   GAIT: Distance walked: not formally assessed  Assistive device utilized: None Level of assistance: supervision  Comments: not overt gait deviations noted   TREATMENT DATE:  02/29/2024 TherEx:  UBE with bilat UE and LE level 2 for 8 minutes (4 fwd, 4 back)  Seated rhomboid stretch 2x45s  Standing RDLs with 10# KB 2x10  Decline squats with 10# KB 2x10    Neuro Re-ed:  Seated ER with scapular retraction using red TB 2x10  Standing rows with blue TB 2x15 with 2s hold   Prone I, Y, T 1x10 each with 2s hold   02/23/2024 TherEx:  Nustep level 5 with bilat UE and LE for 8 minutes  Seated rhomboid stretch 2x30s (added to HEP to replace doorway stretch) Kneeling thoracic extension with red theraball on mat 3x30s  Squats on a decline with 8# DB 1x10  Neuro Re-Ed: Seated ER with scapular retraction using red TB 2x10  Standing rows with blue TB 2x12 with 2s hold   Standing straight arm pulldowns with green TB 2x12 with 2s hold    02/15/24 UBE 4 min backwards for posture T band rows and extensions for posture 3x10 Green Cobra 3x10 Rhomboid stretch doorway 2x30 sec bilateral Standing lumbar extensions 15x Standing hip extensions 2x10 bilateral Chest press with 2# bar 2x10 Punches with 2# DB  3x10   02/09/2024 TherEx:  HEP handout provided with patient performing one set of each exercise for appropriate form. Verbal and tactile cues provided.   Self-Care:  POC                                                                                                                             PATIENT EDUCATION:  Education details: HEP, POC Person educated: Patient Education method: Explanation, Demonstration, Tactile cues, Verbal cues, and Handouts Education comprehension: verbalized understanding, returned demonstration, verbal cues required, and tactile cues required  HOME EXERCISE PROGRAM: Access Code: [email or web address] URL: [email or web address] Date: 02/09/2024 Prepared by: Susannah Daring  Exercises - Seated Scapular Retraction  - 1 x daily - 7 x weekly - 3 sets - 10 reps - Doorway Rhomboid Stretch  - 1 x daily - 7 x weekly - 3 sets - Seated Cervical Retraction  - 1 x daily - 7 x weekly - 3 sets - 10 reps - Sidelying Thoracic Rotation with Open Book  - 1  x daily - 7 x weekly - 3 sets - 10 reps - Cobra  - 1 x daily - 7 x weekly - 3 sets - 10 reps  ASSESSMENT:  CLINICAL IMPRESSION:  Patient arrived to session endorsing improvement in overall symptoms since last session, though continues to have difficulty with endurance. Patient tolerated all activities this date. Patient will continue to benefit from skilled PT.   OBJECTIVE IMPAIRMENTS: decreased activity tolerance, decreased coordination, decreased endurance, decreased ROM, decreased strength, improper body mechanics, postural dysfunction, and pain.   ACTIVITY LIMITATIONS: carrying, lifting, bending, and standing  PARTICIPATION LIMITATIONS: cleaning, community activity, occupation, and yard work  PERSONAL FACTORS: Past/current experiences, Time since onset of injury/illness/exacerbation, and 1 comorbidity: depression are also affecting patient's functional outcome.   REHAB POTENTIAL: Good  CLINICAL DECISION  MAKING: Stable/uncomplicated  EVALUATION COMPLEXITY: Low   GOALS: Goals reviewed with patient? Yes  SHORT TERM GOALS: Target date: 03/01/2024  Patient will show compliance with initial HEP.  Baseline: Goal status: GOAL MET 03/06/2024  2.  Patient will report pain levels no greater than 6/10 to show overall improvement in quality of life. Baseline:  Goal status: goal ongoing, 03/06/2024    LONG TERM GOALS: Target date: 04/05/2024  Patient will be independent with final HEP in order to maintain and progress upon functional gains made within PT. Baseline:  Goal status: INITIAL  2.  Patient will report pain levels no greater than 4/10 to show overall improvement in quality of life. Baseline:  Goal status: INITIAL  3.  Patient will increase PSFS to at least 5.33 to show a significant improvement in subjective disability.  Baseline:  Goal status: INITIAL  4.  Patient will increase thoracic extension ROM to at least 75% WFL in order to improve functional mobility. Baseline:  Goal status: INITIAL  5.  Patient will report ability to do work for at least 1 hour before requiring adjustments secondary to discomfort/pain. Baseline:  Goal status: INITIAL  6.  Patient will increase bilat hip extension MMT to at least 4-/5 in order to improve biomechanics with functional mobility.  Baseline:  Goal status: INITIAL  PLAN:  PT FREQUENCY: 1-2x/week  PT DURATION: 8 weeks  PLANNED INTERVENTIONS: 97164- PT Re-evaluation, 97750- Physical Performance Testing, 97110-Therapeutic exercises, 97530- Therapeutic activity, [id number]- Neuromuscular re-education, 97535- Self Care, [id number]- Manual therapy, [id number]- Gait training, [phone]- Orthotic Initial, [phone]- Orthotic/Prosthetic subsequent, [phone]- Canalith repositioning, [phone]- Aquatic Therapy, [phone]- Electrical stimulation (unattended), [phone]- Electrical stimulation (manual), [id number]- Vasopneumatic device, [id number]- Ultrasound, [id number]- Ionotophoresis 4mg /ml  Dexamethasone, 79439 (1-2 muscles), [id number] (3+ muscles)- Dry Needling, Patient/Family education, Balance training, Stair training, Taping, Joint mobilization, Joint manipulation, Spinal manipulation, Spinal mobilization, Scar mobilization, Compression bandaging, Vestibular training, DME instructions, Cryotherapy, and Moist heat.  PLAN FOR NEXT SESSION:   postural strengthening  PHYSICAL THERAPY DISCHARGE SUMMARY  Visits from Start of Care: 4  Current functional level related to goals / functional outcomes: See above    Remaining deficits: See above    Education / Equipment: HEP   Patient agrees to discharge. Patient goals were partially met. Patient is being discharged due to not returning since the last visit.  Susannah Daring, PT, DPT 03/06/24 12:53 PM  .   "

## 2024-03-06 ENCOUNTER — Ambulatory Visit (INDEPENDENT_AMBULATORY_CARE_PROVIDER_SITE_OTHER)

## 2024-03-06 DIAGNOSIS — M546 Pain in thoracic spine: Secondary | ICD-10-CM

## 2024-03-06 DIAGNOSIS — M25561 Pain in right knee: Secondary | ICD-10-CM | POA: Diagnosis not present

## 2024-03-06 DIAGNOSIS — M25551 Pain in right hip: Secondary | ICD-10-CM

## 2024-03-06 DIAGNOSIS — R293 Abnormal posture: Secondary | ICD-10-CM

## 2024-03-06 DIAGNOSIS — M25511 Pain in right shoulder: Secondary | ICD-10-CM | POA: Diagnosis not present

## 2024-03-06 DIAGNOSIS — M6281 Muscle weakness (generalized): Secondary | ICD-10-CM

## 2024-03-06 DIAGNOSIS — R29898 Other symptoms and signs involving the musculoskeletal system: Secondary | ICD-10-CM

## 2024-03-06 DIAGNOSIS — G8929 Other chronic pain: Secondary | ICD-10-CM

## 2024-03-10 NOTE — Progress Notes (Unsigned)
 Office Visit Note  Patient: Lori Vaughan             Date of Birth: [birth year]           MRN: [id number]             PCP: Shepperson, Kirstin, PA-C Referring: Donata Snowman, PA* Visit Date: 03/21/2024 Occupation: Data Unavailable  Subjective:  No chief complaint on file.  History of Present Illness: Lori Vaughan is a 43 y.o female who is presenting for 3 month follow-up. She was last seen 12/29/23 where discussion was had regarding test results. At that time, patient did not meet diagnostic criteria for SLE and patient was instructed to monitor for symptoms.   Since her last visit, patient continues to denies any oral ulcers, rashes, photosensitivity, raynaud's symptoms, fevers, lymphadenopathy, foamy urine. She does admit to some increased hair loss when brushing her hair or in the shower, no patches missing. She also admits to some new dry eyes and dry mouth since her last visit.  She denies any new concerns at this time.   Activities of Daily Living:  Patient reports morning stiffness for 0 minutes.   Patient Reports nocturnal pain.  Difficulty dressing/grooming: Denies Difficulty climbing stairs: Denies Difficulty getting out of chair: Denies Difficulty using hands for taps, buttons, cutlery, and/or writing: Denies  Review of Systems  Constitutional:  Negative for fatigue.  HENT:  Negative for mouth sores and mouth dryness.   Eyes:  Positive for dryness.  Respiratory:  Negative for shortness of breath.   Cardiovascular:  Negative for chest pain and palpitations.  Gastrointestinal:  Negative for blood in stool, constipation and diarrhea.  Endocrine: Negative for increased urination.  Genitourinary:  Negative for involuntary urination.  Musculoskeletal:  Positive for myalgias and myalgias. Negative for joint pain, gait problem, joint pain, joint swelling, muscle weakness, morning stiffness and muscle tenderness.  Skin:  Positive for hair loss. Negative for  color change, rash and sensitivity to sunlight.  Allergic/Immunologic: Negative for susceptible to infections.  Neurological:  Negative for dizziness and headaches.  Hematological:  Negative for swollen glands.  Psychiatric/Behavioral:  Positive for depressed mood and sleep disturbance. The patient is nervous/anxious.     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  Not on meds now.    Family History  Problem Relation Age of Onset   Breast cancer Maternal Aunt        not sure of age at time of diagnosis   Breast cancer Maternal Aunt        not sure of age at diagnosis   Alcohol abuse Father    Pancreatitis Father        alcoholic--cause of death   Depression Brother        anxiety and depression, drug abuse   Drug abuse Brother    Autism Son    Past Surgical History:  Procedure Laterality Date   CESAREAN SECTION  1998, 1999, 2005, 2012  x4   TUBAL LIGATION     2012   Social History   Tobacco Use   Smoking status: Never    Passive exposure: Never   Smokeless tobacco: Never  Vaping Use   Vaping status: Never Used  Substance Use Topics   Alcohol use: No   Drug use: No   Social History   Social History Narrative   Originally from [place]   Came to [employer] In 2001   Lives at home with husband and 4 children      There is no immunization history on file for this patient.   Objective: Vital Signs: BP 104/67   Pulse 60   Temp 97.9 F (36.6 C)   Resp 12    Ht 5' 4 (1.626 m)   Wt 150 lb (68 kg)   LMP 03/19/2024   BMI 25.75 kg/m    Physical Exam   Musculoskeletal Exam:   CDAI Exam: CDAI Score: -- Patient Global: --; Provider Global: -- Swollen: --; Tender: -- Joint Exam 03/21/2024   No joint exam has been documented for this visit   There is currently no information documented on the homunculus. Go to the Rheumatology activity and complete the homunculus joint exam.  Investigation: No additional findings.  Imaging: No results found.  Recent Labs: Lab Results  Component Value Date   WBC 4.9 12/23/2023   HGB 12.1 12/23/2023   PLT 277 12/23/2023   NA 138 12/23/2023   K 4.2 12/23/2023   CL 106 12/23/2023   CO2 28 12/23/2023   GLUCOSE 96 12/23/2023   BUN 11 12/23/2023   CREATININE 0.44 12/23/2023   BILITOT 0.4 12/23/2023   ALKPHOS 79 12/23/2023   AST 21 12/23/2023   ALT 16 12/23/2023   PROT 7.9 12/23/2023   ALBUMIN 4.1 12/23/2023   CALCIUM 8.9 12/23/2023   GFRAA 140 02/08/2018    Speciality Comments: No specialty comments available.  Procedures:  No procedures performed Allergies: Patient has no known allergies.   Assessment / Plan:     Visit Diagnoses:  Positive ANA (antinuclear antibody) Positive dsDNA Leukopenia (resolved) Patient with low titer positive ANA w/ positive dsDNA. Initially was concerned for possible underlying SLE given new onset leukopenia, however leukopenia resolved on repeat testing, lowering my suspicion. As discussed with patient, given lack of any systemic symptoms of SLE, she currently does not meet clinical criteria for SLE based on 2019 ACR criteria (current score 6; requires >10 for dx). Discussed symptoms with patient that could suggest her disease is progressing, and patient educated to alert us  immediately if she develops any of the aforementioned symptoms. Will continue to monitor patiently closely. Will repeat U/A, UPC, C3, C4, dsDNA, smith today. Will also obtain SSA and SSB  given dry eyes and dry mouth.  Patient with low titer positive ANA w/ positive dsDNA - Plan: Anti-DNA antibody, double-stranded, Anti-Smith antibody, C3 and C4, Protein / creatinine ratio, urine  Low back pain, unspecified back pain laterality, unspecified chronicity, unspecified whether sciatica present - persistent low back pain  Dry eyes - Plan: Sjogren's syndrome antibods(ssa + ssb)  Alopecia - Plan: TSH + free T4, Anti-DNA antibody, double-stranded, Anti-Smith antibody, C3 and C4, Protein / creatinine ratio, urine  Orders: Orders Placed This Encounter  Procedures   Sjogren's syndrome antibods(ssa + ssb)   TSH + free T4   Anti-DNA antibody, double-stranded   Anti-Smith antibody   C3 and C4   Protein / creatinine ratio, urine   No orders of the  defined types were placed in this encounter.  I personally spent a total of 60 minutes in the care of the patient today including preparing to see the patient, getting/reviewing separately obtained history, performing a medically appropriate exam/evaluation, counseling and educating, placing orders, referring and communicating with other health care professionals, documenting clinical information in the EHR, and communicating results.   Follow-Up Instructions: Return in about 6 months (around 09/19/2024).   Asberry Claw, DO  Note - This record has been created using Animal nutritionist.  Chart creation errors have been sought, but may not always  have been located. Such creation errors do not reflect on  the standard of medical care.

## 2024-03-14 ENCOUNTER — Encounter

## 2024-03-20 ENCOUNTER — Encounter

## 2024-03-21 ENCOUNTER — Ambulatory Visit

## 2024-03-21 ENCOUNTER — Telehealth: Payer: Self-pay

## 2024-03-21 VITALS — BP 104/67 | HR 60 | Temp 97.9°F | Resp 12 | Ht 64.0 in | Wt 150.0 lb

## 2024-03-21 DIAGNOSIS — H04123 Dry eye syndrome of bilateral lacrimal glands: Secondary | ICD-10-CM | POA: Diagnosis not present

## 2024-03-21 DIAGNOSIS — R7689 Other specified abnormal immunological findings in serum: Secondary | ICD-10-CM

## 2024-03-21 DIAGNOSIS — Z1283 Encounter for screening for malignant neoplasm of skin: Secondary | ICD-10-CM

## 2024-03-21 DIAGNOSIS — M545 Low back pain, unspecified: Secondary | ICD-10-CM

## 2024-03-21 DIAGNOSIS — L659 Nonscarring hair loss, unspecified: Secondary | ICD-10-CM

## 2024-03-21 NOTE — Telephone Encounter (Signed)
 Attempted to contact the patient and left message for patient to call the office to clarify the reason for referral.

## 2024-03-21 NOTE — Telephone Encounter (Signed)
 Patient contacted the office requesting a referral to Dermatology.  Patient request referral for pt stated she forgot to mention it  Patient's preferred area for referral is: [place]

## 2024-03-22 LAB — PROTEIN / CREATININE RATIO, URINE
Creatinine, Urine: 24 mg/dL (ref 20–275)
Protein/Creat Ratio: 375 mg/g{creat} — ABNORMAL HIGH (ref 24–184)
Protein/Creatinine Ratio: 0.375 mg/mg{creat} — ABNORMAL HIGH (ref 0.024–0.184)
Total Protein, Urine: 9 mg/dL (ref 5–24)

## 2024-03-22 LAB — SJOGREN'S SYNDROME ANTIBODS(SSA + SSB)
SSA (Ro) (ENA) Antibody, IgG: <1 AI
SSB (La) (ENA) Antibody, IgG: <1 AI

## 2024-03-22 LAB — ANTI-SMITH ANTIBODY: ENA SM Ab Ser-aCnc: <1 AI

## 2024-03-22 LAB — C3 AND C4
C3 Complement: 129 mg/dL (ref 83–193)
C4 Complement: 23 mg/dL (ref 15–57)

## 2024-03-22 LAB — TSH+FREE T4: TSH W/REFLEX TO FT4: 2.38 m[IU]/L

## 2024-03-22 LAB — ANTI-DNA ANTIBODY, DOUBLE-STRANDED: ds DNA Ab: 18 [IU]/mL — ABNORMAL HIGH

## 2024-03-22 NOTE — Addendum Note (Signed)
 Addended by: CENA ALFONSO CROME on: 03/22/2024 04:07 PM   Modules accepted: Orders

## 2024-03-22 NOTE — Telephone Encounter (Signed)
 Patient states she would like to have the referral to dermatology for an annual skin exam due to dark spots on her skin. Referral placed.

## 2024-03-23 ENCOUNTER — Other Ambulatory Visit: Payer: Self-pay

## 2024-03-23 ENCOUNTER — Inpatient Hospital Stay: Attending: Hematology and Oncology

## 2024-03-23 DIAGNOSIS — N920 Excessive and frequent menstruation with regular cycle: Secondary | ICD-10-CM | POA: Diagnosis not present

## 2024-03-23 DIAGNOSIS — D509 Iron deficiency anemia, unspecified: Secondary | ICD-10-CM | POA: Insufficient documentation

## 2024-03-23 DIAGNOSIS — D500 Iron deficiency anemia secondary to blood loss (chronic): Secondary | ICD-10-CM

## 2024-03-23 DIAGNOSIS — Z803 Family history of malignant neoplasm of breast: Secondary | ICD-10-CM | POA: Diagnosis not present

## 2024-03-23 LAB — CMP (CANCER CENTER ONLY)
ALT: 29 U/L (ref 0–44)
AST: 33 U/L (ref 15–41)
Albumin: 4.3 g/dL (ref 3.5–5.0)
Alkaline Phosphatase: 87 U/L (ref 38–126)
Anion gap: 10 (ref 5–15)
BUN: 12 mg/dL (ref 6–20)
CO2: 25 mmol/L (ref 22–32)
Calcium: 8.8 mg/dL — ABNORMAL LOW (ref 8.9–10.3)
Chloride: 102 mmol/L (ref 98–111)
Creatinine: 0.52 mg/dL (ref 0.44–1.00)
GFR, Estimated: >60 mL/min (ref >60)
Glucose, Bld: 96 mg/dL (ref 70–99)
Potassium: 3.8 mmol/L (ref 3.5–5.1)
Sodium: 137 mmol/L (ref 135–145)
Total Bilirubin: 0.5 mg/dL (ref 0.0–1.2)
Total Protein: 7.7 g/dL (ref 6.5–8.1)

## 2024-03-23 LAB — CBC WITH DIFFERENTIAL (CANCER CENTER ONLY)
Abs Immature Granulocytes: 0.01 K/uL (ref 0.00–0.07)
Basophils Absolute: 0.1 K/uL (ref 0.0–0.1)
Basophils Relative: 1 %
Eosinophils Absolute: 0.1 K/uL (ref 0.0–0.5)
Eosinophils Relative: 2 %
HCT: 38.6 % (ref 36.0–46.0)
Hemoglobin: 13.4 g/dL (ref 12.0–15.0)
Immature Granulocytes: 0 %
Lymphocytes Relative: 31 %
Lymphs Abs: 1.8 K/uL (ref 0.7–4.0)
MCH: 29.8 pg (ref 26.0–34.0)
MCHC: 34.7 g/dL (ref 30.0–36.0)
MCV: 86 fL (ref 80.0–100.0)
Monocytes Absolute: 0.4 K/uL (ref 0.1–1.0)
Monocytes Relative: 7 %
Neutro Abs: 3.6 K/uL (ref 1.7–7.7)
Neutrophils Relative %: 59 %
Platelet Count: 261 K/uL (ref 150–400)
RBC: 4.49 MIL/uL (ref 3.87–5.11)
RDW: 16.6 % — ABNORMAL HIGH (ref 11.5–15.5)
WBC Count: 5.9 K/uL (ref 4.0–10.5)
nRBC: 0 % (ref 0.0–0.2)

## 2024-03-23 LAB — RETIC PANEL
Immature Retic Fract: 8.1 % (ref 2.3–15.9)
RBC.: 4.52 MIL/uL (ref 3.87–5.11)
Retic Count, Absolute: 76.4 K/uL (ref 19.0–186.0)
Retic Ct Pct: 1.7 % (ref 0.4–3.1)
Reticulocyte Hemoglobin: 35 pg (ref >27.9)

## 2024-03-23 LAB — IRON AND IRON BINDING CAPACITY (CC-WL,HP ONLY)
Iron: 64 ug/dL (ref 28–170)
Saturation Ratios: 17 % (ref 10.4–31.8)
TIBC: 372 ug/dL (ref 250–450)
UIBC: 308 ug/dL

## 2024-03-23 LAB — FERRITIN: Ferritin: 30 ng/mL (ref 11–307)

## 2024-03-28 ENCOUNTER — Encounter

## 2024-03-31 ENCOUNTER — Other Ambulatory Visit (HOSPITAL_COMMUNITY): Payer: Self-pay | Admitting: Physician Assistant

## 2024-03-31 ENCOUNTER — Telehealth: Payer: Self-pay | Admitting: Pharmacy Technician

## 2024-03-31 ENCOUNTER — Inpatient Hospital Stay: Admitting: Physician Assistant

## 2024-03-31 VITALS — BP 100/64 | HR 60 | Temp 97.2°F | Resp 18 | Wt 147.2 lb

## 2024-03-31 DIAGNOSIS — D509 Iron deficiency anemia, unspecified: Secondary | ICD-10-CM | POA: Insufficient documentation

## 2024-03-31 DIAGNOSIS — D500 Iron deficiency anemia secondary to blood loss (chronic): Secondary | ICD-10-CM | POA: Diagnosis not present

## 2024-03-31 NOTE — Progress Notes (Signed)
 [facility] [phone]   [phone]  PROGRESS NOTE  Patient Care Team: Shepperson, Kirstin, PA-C as PCP - General (Family Medicine)  Hematological/Oncological History # Iron Deficiency Anemia 2/2 to GYN Bleeding  11/02/2023: White blood cell 3.6, hemoglobin 1.0, MCV 70.7, platelets 06/25/1969.  Ferritin 1, iron sat 4%, TIBC 493  12/23/2023: establish care with Dr. Federico  01/17/2024-01/24/2024: IV feraheme  510 mg x 2 doses  CHIEF COMPLAINTS/PURPOSE OF CONSULTATION:  Iron Deficiency Anemia    HISTORY OF PRESENTING ILLNESS:  Lori Vaughan 43 y.o. female returns for follow-up for iron deficiency anemia.  She was last seen by Dr. Federico on 12/23/2023.  In the interim she received IV Feraheme  x 2 doses.  She is unaccompanied for this visit.  Mr. Grandmaison reports that her energy levels have improved since receiving IV iron.  Her menstrual cycle is unchanged lasting 3 days with 2 days of very heavy bleeding with clots.  She is no longer experiencing any dizziness.  She denies fevers, chills, night sweats, shortness of breath, chest pain, cough,or headaches.  Rest of the 10 point ROS as below.  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  Not on meds now.    SURGICAL HISTORY: Past Surgical History:  Procedure Laterality Date   CESAREAN SECTION  1998, 1999, 2005, 2012   x4   TUBAL LIGATION     2012    SOCIAL HISTORY: Social History   Socioeconomic History   Marital status: Married    Spouse name: Donley   Number of children: 4   Years of education: 6   Highest education level: Not on file  Occupational History   Occupation: Housewife  Tobacco Use    Smoking status: Never    Passive exposure: Never   Smokeless tobacco: Never  Vaping Use   Vaping status: Never Used  Substance and Sexual Activity   Alcohol use: No   Drug use: No   Sexual activity: Yes    Birth control/protection: Surgical  Other Topics Concern   Not on file  Social History Narrative   Originally from [place]   Came to [employer] In 2001   Lives at home with husband and 4 children   Social Drivers of Health   Tobacco Use: Low Risk (03/21/2024)   Patient History    Smoking Tobacco Use: Never    Smokeless Tobacco Use: Never    Passive Exposure: Never  Financial Resource Strain: Low Risk (11/22/2023)   Received from [facility]   Overall Financial Resource Strain (CARDIA)    How hard is it for you to pay for the very basics like food, housing, medical care, and heating?: Not very hard  Food Insecurity: No Food Insecurity (12/23/2023)   Epic    Worried About Radiation Protection Practitioner of Food in the Last Year: Never true    Ran Out of Food in the Last Year: Never true  Transportation Needs: No Transportation Needs (12/23/2023)   Epic    Lack of Transportation (Medical): No    Lack of Transportation (Non-Medical): No  Physical Activity: Inactive (11/02/2023)   Exercise Vital Sign    Days of Exercise per Week: 0 days    Minutes of Exercise  per Session: 0 min  Stress: Stress Concern Present (11/22/2023)   Received from [facility] - Occupational Stress Questionnaire    Do you feel stress - tense, restless, nervous, or anxious, or unable to sleep at night because your mind is troubled all the time - these days?: Very much  Social Connections: Socially Integrated (11/22/2023)   Received from [facility]   Social Network    How would you rate your social network (family, work, friends)?: Good participation with social networks  Recent Concern: Social Connections - Moderately Isolated (11/02/2023)   Social Connection and Isolation Panel     Frequency of Communication with Friends and Family: More than three times a week    Frequency of Social Gatherings with Friends and Family: Once a week    Attends Religious Services: Never    Database Administrator or Organizations: No    Attends Banker Meetings: Never    Marital Status: Married  Catering Manager Violence: Not At Risk (12/23/2023)   Epic    Fear of Current or Ex-Partner: No    Emotionally Abused: No    Physically Abused: No    Sexually Abused: No  Depression (PHQ2-9): Low Risk (12/23/2023)   Depression (PHQ2-9)    PHQ-2 Score: 3  Recent Concern: Depression (PHQ2-9) - Medium Risk (11/02/2023)   Depression (PHQ2-9)    PHQ-2 Score: 6  Alcohol Screen: Low Risk (11/02/2023)   Alcohol Screen    Last Alcohol Screening Score (AUDIT): 1  Housing: Unknown (12/23/2023)   Epic    Unable to Pay for Housing in the Last Year: No    Number of Times Moved in the Last Year: Not on file    Homeless in the Last Year: No  Utilities: Not At Risk (12/23/2023)   Epic    Threatened with loss of utilities: No  Health Literacy: Adequate Health Literacy (11/02/2023)   B1300 Health Literacy    Frequency of need for help with medical instructions: Never    FAMILY HISTORY: Family History  Problem Relation Age of Onset   Breast cancer Maternal Aunt        not sure of age at time of diagnosis   Breast cancer Maternal Aunt        not sure of age at diagnosis   Alcohol abuse Father    Pancreatitis Father        alcoholic--cause of death   Depression Brother        anxiety and depression, drug abuse   Drug abuse Brother    Autism Son     ALLERGIES:  has no known allergies.  MEDICATIONS:  Current Outpatient Medications  Medication Sig Dispense Refill   Ascorbic Acid (VITAMIN C PO) Take by mouth.     ASHWAGANDHA PO Take by mouth.     Collagen-Vitamin C-Biotin (COLLAGEN PO) Take by mouth.     Ferrous Sulfate  (IRON PO) Take by mouth.     ibuprofen  (ADVIL ,MOTRIN ) 800 MG tablet  Take 1 tablet (800 mg total) by mouth every 8 (eight) hours as needed. 60 tablet 1   MAGNESIUM PO Take by mouth.     Probiotic Product (PROBIOTIC PO) Take by mouth.     Vitamin D-Vitamin K (VITAMIN K2-VITAMIN D3 PO) Take by mouth.     DULoxetine  (CYMBALTA ) 30 MG capsule Take 1 capsule (30 mg total) once a day by mouth for 2 weeks, then take 1 capsule (30 mg) twice a  day. (Patient not taking: Reported on 03/31/2024) 60 capsule 3   PROGESTERONE PO Take by mouth. (Patient not taking: Reported on 03/31/2024)     No current facility-administered medications for this visit.    REVIEW OF SYSTEMS:   Constitutional: ( - ) fevers, ( - )  chills , ( - ) night sweats Eyes: ( - ) blurriness of vision, ( - ) double vision, ( - ) watery eyes Ears, nose, mouth, throat, and face: ( - ) mucositis, ( - ) sore throat Respiratory: ( - ) cough, ( - ) dyspnea, ( - ) wheezes Cardiovascular: ( - ) palpitation, ( - ) chest discomfort, ( - ) lower extremity swelling Gastrointestinal:  ( - ) nausea, ( - ) heartburn, ( - ) change in bowel habits Skin: ( - ) abnormal skin rashes Lymphatics: ( - ) new lymphadenopathy, ( - ) easy bruising Neurological: ( - ) numbness, ( - ) tingling, ( - ) new weaknesses Behavioral/Psych: ( - ) mood change, ( - ) new changes  All other systems were reviewed with the patient and are negative.  PHYSICAL EXAMINATION:  Vitals:   03/31/24 1505 03/31/24 1510  BP: (!) 105/58 100/64  Pulse: 60   Resp: 18   Temp: (!) 97.2 F (36.2 C)   SpO2: 100%    Filed Weights   03/31/24 1505  Weight: 147 lb 3.2 oz (66.8 kg)    GENERAL: well appearing middle-age Hispanic female in NAD  SKIN: skin color, texture, turgor are normal, no rashes or significant lesions EYES: conjunctiva are pink and non-injected, sclera clear LUNGS: clear to auscultation and percussion with normal breathing effort HEART: regular rate & rhythm and no murmurs and no lower extremity edema Musculoskeletal: no cyanosis  of digits and no clubbing  PSYCH: alert & oriented x 3, fluent speech NEURO: no focal motor/sensory deficits  LABORATORY DATA:  I have reviewed the data as listed    Latest Ref Rng & Units 03/23/2024    2:49 PM 12/23/2023    9:43 AM 11/02/2023    2:46 PM  CBC  WBC 4.0 - 10.5 K/uL 5.9  4.9  3.6   Hemoglobin 12.0 - 15.0 g/dL 86.5  87.8  88.9   Hematocrit 36.0 - 46.0 % 38.6  38.3  35.1   Platelets 150 - 400 K/uL 261  277  371.0        Latest Ref Rng & Units 03/23/2024    2:49 PM 12/23/2023    9:43 AM 11/02/2023    2:46 PM  CMP  Glucose 70 - 99 mg/dL 96  96  878   BUN 6 - 20 mg/dL 12  11  12    Creatinine 0.44 - 1.00 mg/dL 9.47  9.55  9.50   Sodium 135 - 145 mmol/L 137  138  138   Potassium 3.5 - 5.1 mmol/L 3.8  4.2  4.1   Chloride 98 - 111 mmol/L 102  106  103   CO2 22 - 32 mmol/L 25  28  28    Calcium 8.9 - 10.3 mg/dL 8.8  8.9  9.0   Total Protein 6.5 - 8.1 g/dL 7.7  7.9  7.7   Total Bilirubin 0.0 - 1.2 mg/dL 0.5  0.4  0.5   Alkaline Phos 38 - 126 U/L 87  79  85   AST 15 - 41 U/L 33  21  16   ALT 0 - 44 U/L 29  16  12  ASSESSMENT & PLAN 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   # Iron Deficiency Anemia 2/2 to GYN Bleeding -- Findings are consistent with iron deficiency anemia secondary to patient's menorrhagia --Will make referral to OB/GYN for better control of her menstrual cycles. --Last received IV feraheme  510 x 2 doses from 01/17/2024-01/24/2024.  --Labs from 03/23/2024 showed no evidence of anemia with Hgb 13.4, MCV 86.0. Iron panel shows  ferritin 30, iron 64, TIBC 372, saturaiton 17%.  --Since ferritin levels are low end of normal, recommend IV feraheme  x 1 dose to bolster iron levels.  --Continue with ferrous sulfate  325 mg daily with a source of vitamin C.  Also noted the patient that she could try Floradix if p.o. ferrous sulfate  therapy is too difficult on her stomach. --RTC in 3 months with labs only and 6  months with labs/follow up.    No orders of the defined types were placed in this encounter.   All questions were answered. The patient knows to call the clinic with any problems, questions or concerns.   I have spent a total of 25 minutes minutes of face-to-face and non-face-to-face time, preparing to see the patient, performing a medically appropriate examination, counseling and educating the patient,  documenting clinical information in the electronic health record, independently interpreting results and communicating results to the patient, and care coordination.   Johnston Police PA-C Dept of Hematology and Oncology [facility] Phone: [phone]   03/31/2024 3:21 PM

## 2024-03-31 NOTE — Telephone Encounter (Signed)
 Auth Submission: PENDING Site of care: Site of care: [facility] Payer: BCBS ANTHEM Medication & CPT/J Code(s) submitted: Feraheme  (ferumoxytol ) [id number] Diagnosis Code: D50.9 Route of submission (phone, fax, portal):  Phone #[phone] Fax #[phone] Auth type: Buy/Bill PB Units/visits requested: 510MG  X 2 DOSES Reference number: [id number] Approval from:  to

## 2024-04-04 ENCOUNTER — Encounter

## 2024-04-04 ENCOUNTER — Encounter (HOSPITAL_COMMUNITY): Payer: Self-pay | Admitting: Physician Assistant

## 2024-04-07 NOTE — Telephone Encounter (Signed)
 Auth Submission: APPROVED Site of care: [facility] Payer: BCBS ANTHEM Medication & CPT/J Code(s) submitted: Feraheme  (ferumoxytol ) [id number] Diagnosis Code: D50.9 Route of submission (phone, fax, portal):  Phone #[phone] Fax #[phone] Auth type: Buy/Bill HB Units/visits requested: 510MG  X 2 DOSES Reference number: [id number] Approval from: 03/31/24 to 03/31/25  Updated SOC to [facility]. Approval letter scanned into the media tab.  Damiano Stamper, CPhT [facility] Phone: [phone] 04/07/2024

## 2024-04-16 ENCOUNTER — Ambulatory Visit: Payer: Self-pay

## 2024-07-14 ENCOUNTER — Ambulatory Visit

## 2024-09-19 ENCOUNTER — Ambulatory Visit

## 2024-10-23 ENCOUNTER — Ambulatory Visit: Admitting: Physician Assistant
